# Patient Record
Sex: Female | Born: 1935 | Race: White | Hispanic: No | Smoking: Never smoker
Health system: Southern US, Community
[De-identification: ages and names within clinical notes are randomized; demographics above are authoritative.]

## PROBLEM LIST (undated history)

## (undated) DIAGNOSIS — F0390 Unspecified dementia without behavioral disturbance: Secondary | ICD-10-CM

## (undated) DIAGNOSIS — K5792 Diverticulitis of intestine, part unspecified, without perforation or abscess without bleeding: Secondary | ICD-10-CM

## (undated) DIAGNOSIS — K219 Gastro-esophageal reflux disease without esophagitis: Secondary | ICD-10-CM

## (undated) DIAGNOSIS — I10 Essential (primary) hypertension: Secondary | ICD-10-CM

## (undated) DIAGNOSIS — W19XXXA Unspecified fall, initial encounter: Secondary | ICD-10-CM

## (undated) DIAGNOSIS — R296 Repeated falls: Secondary | ICD-10-CM

## (undated) DIAGNOSIS — M1990 Unspecified osteoarthritis, unspecified site: Secondary | ICD-10-CM

## (undated) DIAGNOSIS — E039 Hypothyroidism, unspecified: Secondary | ICD-10-CM

---

## 2012-02-25 ENCOUNTER — Emergency Department (HOSPITAL_COMMUNITY)
Admission: EM | Admit: 2012-02-25 | Discharge: 2012-02-25 | Disposition: A | Discharge status: Skilled Nursing Facility | Payer: Medicare Other | Attending: Emergency Medicine | Admitting: Emergency Medicine

## 2012-02-25 ENCOUNTER — Encounter (HOSPITAL_COMMUNITY): Payer: Self-pay | Admitting: *Deleted

## 2012-02-25 DIAGNOSIS — I10 Essential (primary) hypertension: Secondary | ICD-10-CM | POA: Insufficient documentation

## 2012-02-25 DIAGNOSIS — M129 Arthropathy, unspecified: Secondary | ICD-10-CM | POA: Insufficient documentation

## 2012-02-25 DIAGNOSIS — Z008 Encounter for other general examination: Secondary | ICD-10-CM | POA: Insufficient documentation

## 2012-02-25 DIAGNOSIS — E039 Hypothyroidism, unspecified: Secondary | ICD-10-CM | POA: Insufficient documentation

## 2012-02-25 DIAGNOSIS — F0390 Unspecified dementia without behavioral disturbance: Secondary | ICD-10-CM | POA: Insufficient documentation

## 2012-02-25 DIAGNOSIS — W19XXXA Unspecified fall, initial encounter: Secondary | ICD-10-CM

## 2012-02-25 DIAGNOSIS — K219 Gastro-esophageal reflux disease without esophagitis: Secondary | ICD-10-CM | POA: Insufficient documentation

## 2012-02-25 HISTORY — DX: Hypothyroidism, unspecified: E03.9

## 2012-02-25 HISTORY — DX: Unspecified osteoarthritis, unspecified site: M19.90

## 2012-02-25 HISTORY — DX: Gastro-esophageal reflux disease without esophagitis: K21.9

## 2012-02-25 HISTORY — DX: Unspecified dementia, unspecified severity, without behavioral disturbance, psychotic disturbance, mood disturbance, and anxiety: F03.90

## 2012-02-25 HISTORY — DX: Essential (primary) hypertension: I10

## 2012-02-25 HISTORY — DX: Diverticulitis of intestine, part unspecified, without perforation or abscess without bleeding: K57.92

## 2012-02-25 NOTE — ED Provider Notes (Signed)
History    78 year old female transferred from nursing facility for evaluation after a fall. This was unwitnessed. Patient was found on the floor sitting against the wall. The circumstances for her getting they are not clear. Patient is demented at baseline. No report of acute change in her mental status. Patient is demented and history is somewhat questionable, but patient denies any pain. No blood thinning medications per review of medication list.  CSN: [id number]  Arrival date & time 02/25/12  1191   First MD Initiated Contact with Patient 02/25/12 [phone]      Chief Complaint  Patient presents with  . Fall    (Consider location/radiation/quality/duration/timing/severity/associated sxs/prior treatment) HPI  Past Medical History  Diagnosis Date  . Dementia   . Hypothyroid   . Arthritis   . Hypertension   . GERD (gastroesophageal reflux disease)   . Diverticulitis     History reviewed. No pertinent past surgical history.  History reviewed. No pertinent family history.  History  Substance Use Topics  . Smoking status: Not on file  . Smokeless tobacco: Not on file  . Alcohol Use: No    OB History    Grav Para Term Preterm Abortions TAB SAB Ect Mult Living                  Review of Systems   Review of symptoms negative unless otherwise noted in HPI.   Allergies  Aricept; Codeine; Hydrocodone; Meloxicam; and Sulfa antibiotics  Home Medications   Current Outpatient Rx  Name Route Sig Dispense Refill  . ACETAMINOPHEN 500 MG PO TABS Oral Take 500 mg by mouth every 12 (twelve) hours. 9a and 9p scheduled doses    . CHOLECALCIFEROL 1000 UNITS PO CAPS Oral Take 1,000 Units by mouth daily. Agitation/ anxiety    . LEVOTHYROXINE SODIUM 50 MCG PO TABS Oral Take 50 mcg by mouth daily.    Marland Kitchen LORAZEPAM 0.5 MG PO TABS Oral Take 0.25-0.5 mg by mouth every 12 (twelve) hours as needed.    . TRIPLE ANTIBIOTIC [phone] EX OINT Topical Apply 1 application topically daily. Apply  to wound with daily dressing change to left arm    . NYSTATIN-TRIAMCINOLONE 100000-0.1 UNIT/GM-% EX CREA Topical Apply 1 application topically 2 (two) times daily. Apply to patient's bottom    . FISH OIL PO Oral Take 1 capsule by mouth daily. 1000-340mg     . OMEPRAZOLE 20 MG PO CPDR Oral Take 20 mg by mouth daily.      BP 162/74  Pulse 62  Temp 97.5 F (36.4 C) (Oral)  Resp 16  SpO2 98%  Physical Exam  Nursing note and vitals reviewed. Constitutional:       Laying in bed. No acute distress. Frail appearing.  HENT:  Head: Normocephalic and atraumatic.  Eyes: Conjunctivae are normal. Right eye exhibits no discharge. Left eye exhibits no discharge.  Neck: Neck supple.  Cardiovascular: Normal rate, regular rhythm and normal heart sounds.  Exam reveals no gallop and no friction rub.   No murmur heard. Pulmonary/Chest: Effort normal and breath sounds normal. No respiratory distress.  Abdominal: Soft. She exhibits no distension. There is no tenderness.  Musculoskeletal: She exhibits no edema and no tenderness.       No midline spinal tenderness. No bony tenderness of the extremities , chest wall or back. Range of motion of the large joints without apparent discomfort.  Neurological: She is alert.       Pleasantly demented. Disoriented to place and time. Does  follow basic commands. Moving all extremities equally. No focal deficits noted.  Skin: Skin is warm and dry.  Psychiatric: She has a normal mood and affect. Her behavior is normal. Thought content normal.    ED Course  Procedures (including critical care time)  Labs Reviewed - No data to display No results found.  EKG:  Rhythm: sinus with PACs Rate: 65 Axis: Left Intervals: LAFB ST segments: t wave flattening inferiorly and laterally   1. Fall at nursing home       MDM  78 year old female transferred from nursing home for evaluation after fall. She has no acute complaints. She's a nonfocal exam. She appears to be at  her baseline from what is reported. Do not feel that imaging or further testing is indicated at this time. Patient has been medically screen. Extremely low suspicion for urgent/emergent issue. We'll discharge back to nursing facility.        [name], MD 03/04/12 1600

## 2012-02-25 NOTE — ED Notes (Signed)
[facility] called for transportation  

## 2012-02-25 NOTE — ED Notes (Signed)
Patient arrived via EMS from [facility].  Staff reports she fell.  When EMS arrived she was sitting on the floor resting against the wall. Hematoma to the right side of her.  Denies pain.

## 2012-05-26 ENCOUNTER — Encounter (HOSPITAL_COMMUNITY): Payer: Self-pay | Admitting: Emergency Medicine

## 2012-05-26 ENCOUNTER — Emergency Department (HOSPITAL_COMMUNITY)
Admission: EM | Admit: 2012-05-26 | Discharge: 2012-05-26 | Disposition: A | Discharge status: Home / Self Care | Payer: Medicare Other | Attending: Emergency Medicine | Admitting: Emergency Medicine

## 2012-05-26 ENCOUNTER — Emergency Department (HOSPITAL_COMMUNITY): Payer: Medicare Other

## 2012-05-26 DIAGNOSIS — N390 Urinary tract infection, site not specified: Secondary | ICD-10-CM | POA: Insufficient documentation

## 2012-05-26 DIAGNOSIS — Y9210 Unspecified residential institution as the place of occurrence of the external cause: Secondary | ICD-10-CM | POA: Insufficient documentation

## 2012-05-26 DIAGNOSIS — K219 Gastro-esophageal reflux disease without esophagitis: Secondary | ICD-10-CM | POA: Insufficient documentation

## 2012-05-26 DIAGNOSIS — Z8739 Personal history of other diseases of the musculoskeletal system and connective tissue: Secondary | ICD-10-CM | POA: Insufficient documentation

## 2012-05-26 DIAGNOSIS — I10 Essential (primary) hypertension: Secondary | ICD-10-CM | POA: Insufficient documentation

## 2012-05-26 DIAGNOSIS — S0990XA Unspecified injury of head, initial encounter: Secondary | ICD-10-CM | POA: Insufficient documentation

## 2012-05-26 DIAGNOSIS — E039 Hypothyroidism, unspecified: Secondary | ICD-10-CM | POA: Insufficient documentation

## 2012-05-26 DIAGNOSIS — Y939 Activity, unspecified: Secondary | ICD-10-CM | POA: Insufficient documentation

## 2012-05-26 DIAGNOSIS — S0101XA Laceration without foreign body of scalp, initial encounter: Secondary | ICD-10-CM

## 2012-05-26 DIAGNOSIS — Z8719 Personal history of other diseases of the digestive system: Secondary | ICD-10-CM | POA: Insufficient documentation

## 2012-05-26 DIAGNOSIS — Z79899 Other long term (current) drug therapy: Secondary | ICD-10-CM | POA: Insufficient documentation

## 2012-05-26 DIAGNOSIS — W19XXXA Unspecified fall, initial encounter: Secondary | ICD-10-CM | POA: Insufficient documentation

## 2012-05-26 DIAGNOSIS — S0100XA Unspecified open wound of scalp, initial encounter: Secondary | ICD-10-CM | POA: Insufficient documentation

## 2012-05-26 DIAGNOSIS — F0390 Unspecified dementia without behavioral disturbance: Secondary | ICD-10-CM | POA: Insufficient documentation

## 2012-05-26 LAB — PROTIME-INR
INR: 0.93 (ref 0.00–1.49)
Prothrombin Time: 12.4 seconds (ref 11.6–15.2)

## 2012-05-26 LAB — URINALYSIS, ROUTINE W REFLEX MICROSCOPIC
Bilirubin Urine: NEGATIVE
Nitrite: NEGATIVE
Specific Gravity, Urine: 1.009 (ref 1.005–1.030)
Urobilinogen, UA: 1 mg/dL (ref 0.0–1.0)

## 2012-05-26 LAB — CBC
Platelets: 182 10*3/uL (ref 150–400)
RBC: 3.83 MIL/uL — ABNORMAL LOW (ref 3.87–5.11)
WBC: 6.2 10*3/uL (ref 4.0–10.5)

## 2012-05-26 LAB — BASIC METABOLIC PANEL
CO2: 30 mEq/L (ref 19–32)
Chloride: 103 mEq/L (ref 96–112)
Sodium: 139 mEq/L (ref 135–145)

## 2012-05-26 LAB — URINE MICROSCOPIC-ADD ON

## 2012-05-26 MED ORDER — CEPHALEXIN 500 MG PO CAPS: 500.0000 mg | ORAL_CAPSULE | Freq: Four times a day (QID) | ORAL | Status: DC

## 2012-05-26 MED ORDER — LIDOCAINE HCL (PF) 1 % IJ SOLN
INTRAMUSCULAR | Status: AC
  Administered 2012-05-26: 2.1 mL
  Filled 2012-05-26: qty 5

## 2012-05-26 MED ORDER — CEFTRIAXONE SODIUM 1 G IJ SOLR
1.0000 g | Freq: Once | INTRAMUSCULAR | Status: AC
  Administered 2012-05-26: 1 g via INTRAMUSCULAR
  Filled 2012-05-26: qty 10

## 2012-05-26 NOTE — ED Provider Notes (Signed)
History     CSN: [id number]  Arrival date & time 05/26/12  6213   First MD Initiated Contact with Patient 05/26/12 1007      Chief Complaint  Patient presents with  . Fall     The history is provided by the patient, the EMS personnel and the nursing home. History Limited By: Level V caveat: Dementia.   and the patient fell while in the bathroom this morning.  It's unknown to patient loss consciousness.  The patient is without any anticoagulant use.  The patient presents with a laceration to her her right parietal scalp.  Mild neck pain.  No weakness of her upper lower extremities.  The patient has dementia and therefore is unable to provide additional history.  Family reports is at baseline mental status  Past Medical History  Diagnosis Date  . Dementia   . Hypothyroid   . Arthritis   . Hypertension   . GERD (gastroesophageal reflux disease)   . Diverticulitis     History reviewed. No pertinent past surgical history.  No family history on file.  History  Substance Use Topics  . Smoking status: Not on file  . Smokeless tobacco: Not on file  . Alcohol Use: No    OB History    Grav Para Term Preterm Abortions TAB SAB Ect Mult Living                  Review of Systems  Unable to perform ROS   Allergies  Aricept; Codeine; Hydrocodone; Meloxicam; and Sulfa antibiotics  Home Medications   Current Outpatient Rx  Name  Route  Sig  Dispense  Refill  . ACETAMINOPHEN 500 MG PO TABS   Oral   Take 500 mg by mouth 2 (two) times daily. 9a and 9p scheduled doses         . VITAMIN D 2000 UNITS PO TABS   Oral   Take 2,000 Units by mouth daily.         Marland Kitchen LEVOTHYROXINE SODIUM 50 MCG PO TABS   Oral   Take 50 mcg by mouth daily.         Marland Kitchen LORAZEPAM 0.5 MG PO TABS   Oral   Take 0.5 mg by mouth every 6 (six) hours as needed. For agitation         . NYSTATIN-TRIAMCINOLONE 100000-0.1 UNIT/GM-% EX CREA   Topical   Apply 1 application topically 2 (two) times  daily. To Gluteal folds for fungal rash         . FISH OIL PO   Oral   Take 1 capsule by mouth daily. 1000-340mg          . OMEPRAZOLE 20 MG PO CPDR   Oral   Take 20 mg by mouth daily before breakfast.            BP 132/63  Pulse 63  Temp 97.6 F (36.4 C) (Oral)  Resp 18  SpO2 97%  Physical Exam  Nursing note and vitals reviewed. Constitutional: She appears well-developed and well-nourished. No distress.  HENT:  Head: Normocephalic and atraumatic.       Small 0.5 cm laceration to right parietal scalp without active bleeding.  Eyes: EOM are normal.  Neck: Normal range of motion.  Cardiovascular: Normal rate, regular rhythm and normal heart sounds.   Pulmonary/Chest: Effort normal and breath sounds normal.  Abdominal: Soft. She exhibits no distension. There is no tenderness.  Musculoskeletal: Normal range of motion.  Neurological: She is  alert.       5 out of 5 strength in bilateral upper lower extremities  Skin: Skin is warm and dry.  Psychiatric: She has a normal mood and affect. Judgment normal.    ED Course  Procedures   LACERATION REPAIR Performed by: Lyanne Co Consent: Verbal consent obtained. Risks and benefits: risks, benefits and alternatives were discussed Patient identity confirmed: provided demographic data Time out performed prior to procedure Prepped and Draped in normal sterile fashion Wound explored Laceration Location: right parietal scalp Laceration Length: 0.5cm No Foreign Bodies seen or palpated Anesthesia:none Local anesthetic: none Anesthetic total: none Irrigation method: syringe Amount of cleaning: standard Skin closure: staple Number of sutures or staples: 1 Technique: staple Patient tolerance: Patient tolerated the procedure well with no immediate complications.  Labs Reviewed  CBC - Abnormal; Notable for the following:    RBC 3.83 (*)     Hemoglobin 11.2 (*)     HCT 34.5 (*)     All other components within normal  limits  BASIC METABOLIC PANEL - Abnormal; Notable for the following:    GFR calc non Af Amer 42 (*)     GFR calc Af Amer 49 (*)     All other components within normal limits  URINALYSIS, ROUTINE W REFLEX MICROSCOPIC - Abnormal; Notable for the following:    APPearance CLOUDY (*)     Hgb urine dipstick MODERATE (*)     Leukocytes, UA MODERATE (*)     All other components within normal limits  URINE MICROSCOPIC-ADD ON - Abnormal; Notable for the following:    Squamous Epithelial / LPF FEW (*)     Bacteria, UA MANY (*)     All other components within normal limits  PROTIME-INR  URINE CULTURE   Ct Head Wo Contrast  05/26/2012  *RADIOLOGY REPORT*  Clinical Data:  Fall, trauma, seizure, pain.  Trauma top of head. Dimension.  CT HEAD WITHOUT CONTRAST CT CERVICAL SPINE WITHOUT CONTRAST  Technique:  Multidetector CT imaging of the head and cervical spine was performed following the standard protocol without intravenous contrast.  Multiplanar CT image reconstructions of the cervical spine were also generated.  Comparison:   None  CT HEAD  Findings: No intracranial hemorrhage.  No parenchymal contusion. No midline shift or mass effect.  Basilar cisterns are patent. No skull base fracture.  No fluid in the paranasal sinuses or mastoid air cells.  There is extensive cortical atrophy and periventricular white matter hypodensities.  There is a laceration over the right vertex (image 50).  IMPRESSION: No intracranial trauma.  Laceration over the right vertex.  No skull fracture.  Atrophy and microvascular disease.  CT CERVICAL SPINE  Findings: There is no prevertebral soft tissue swelling.  There is bulky osteophytosis at C4-C6.  There is no acute loss vertebral body height or disc height.  Normal facet articulation.  Normal craniocervical junction.  No evidence epidural paraspinal hematoma.  IMPRESSION:  1.  No cervical spine fracture.  2 . Bulky osteophytosis and degenerative disease.   Original Report  Authenticated By: Genevive Bi, M.D.    Ct Cervical Spine Wo Contrast  05/26/2012  *RADIOLOGY REPORT*  Clinical Data:  Fall, trauma, seizure, pain.  Trauma top of head. Dimension.  CT HEAD WITHOUT CONTRAST CT CERVICAL SPINE WITHOUT CONTRAST  Technique:  Multidetector CT imaging of the head and cervical spine was performed following the standard protocol without intravenous contrast.  Multiplanar CT image reconstructions of the cervical spine were also generated.  Comparison:   None  CT HEAD  Findings: No intracranial hemorrhage.  No parenchymal contusion. No midline shift or mass effect.  Basilar cisterns are patent. No skull base fracture.  No fluid in the paranasal sinuses or mastoid air cells.  There is extensive cortical atrophy and periventricular white matter hypodensities.  There is a laceration over the right vertex (image 50).  IMPRESSION: No intracranial trauma.  Laceration over the right vertex.  No skull fracture.  Atrophy and microvascular disease.  CT CERVICAL SPINE  Findings: There is no prevertebral soft tissue swelling.  There is bulky osteophytosis at C4-C6.  There is no acute loss vertebral body height or disc height.  Normal facet articulation.  Normal craniocervical junction.  No evidence epidural paraspinal hematoma.  IMPRESSION:  1.  No cervical spine fracture.  2 . Bulky osteophytosis and degenerative disease.   Original Report Authenticated By: Genevive Bi, M.D.    I personally reviewed the imaging tests through PACS system I reviewed available ER/hospitalization records through the EMR   1. Head injury   2. Laceration of scalp   3. Urinary tract infection       MDM  CT head and cervical spine normal.  Single staple placed to right scalp laceration.  Infection warnings given.  Head injury warnings given.  Urine will be treated for a UTI        Lyanne Co, MD 05/27/12 [phone]

## 2012-05-26 NOTE — ED Notes (Signed)
Patient transported to CT 

## 2012-05-26 NOTE — ED Notes (Signed)
Removed [place]  c-collar per EDP

## 2012-05-26 NOTE — ED Notes (Signed)
Pt arrived by EMS from [facility]. Pt fell while in the bathroom this morning. Unknown if pt loss consciousness, staff at facility stated they just heard pt yelling. Pt hit top right of head. Pt ambulating at facility normally.

## 2012-05-28 LAB — URINE CULTURE: Colony Count: >=100000

## 2012-05-29 NOTE — ED Notes (Signed)
+  Urine. Patient treated with Keflex. Sensitive to same. Per protocol MD. °

## 2012-09-09 ENCOUNTER — Encounter (HOSPITAL_COMMUNITY): Payer: Self-pay | Admitting: *Deleted

## 2012-09-09 ENCOUNTER — Emergency Department (HOSPITAL_COMMUNITY)
Admission: EM | Admit: 2012-09-09 | Discharge: 2012-09-09 | Disposition: A | Discharge status: Home / Self Care | Payer: PRIVATE HEALTH INSURANCE | Attending: Emergency Medicine | Admitting: Emergency Medicine

## 2012-09-09 ENCOUNTER — Emergency Department (HOSPITAL_COMMUNITY): Payer: PRIVATE HEALTH INSURANCE

## 2012-09-09 DIAGNOSIS — S0003XA Contusion of scalp, initial encounter: Secondary | ICD-10-CM | POA: Insufficient documentation

## 2012-09-09 DIAGNOSIS — W19XXXA Unspecified fall, initial encounter: Secondary | ICD-10-CM

## 2012-09-09 DIAGNOSIS — Y9210 Unspecified residential institution as the place of occurrence of the external cause: Secondary | ICD-10-CM | POA: Insufficient documentation

## 2012-09-09 DIAGNOSIS — E039 Hypothyroidism, unspecified: Secondary | ICD-10-CM | POA: Insufficient documentation

## 2012-09-09 DIAGNOSIS — Y939 Activity, unspecified: Secondary | ICD-10-CM | POA: Insufficient documentation

## 2012-09-09 DIAGNOSIS — Z79899 Other long term (current) drug therapy: Secondary | ICD-10-CM | POA: Insufficient documentation

## 2012-09-09 DIAGNOSIS — S0083XA Contusion of other part of head, initial encounter: Secondary | ICD-10-CM | POA: Insufficient documentation

## 2012-09-09 DIAGNOSIS — I10 Essential (primary) hypertension: Secondary | ICD-10-CM | POA: Insufficient documentation

## 2012-09-09 DIAGNOSIS — S61409A Unspecified open wound of unspecified hand, initial encounter: Secondary | ICD-10-CM | POA: Insufficient documentation

## 2012-09-09 DIAGNOSIS — F0390 Unspecified dementia without behavioral disturbance: Secondary | ICD-10-CM | POA: Insufficient documentation

## 2012-09-09 DIAGNOSIS — K219 Gastro-esophageal reflux disease without esophagitis: Secondary | ICD-10-CM | POA: Insufficient documentation

## 2012-09-09 DIAGNOSIS — Z8719 Personal history of other diseases of the digestive system: Secondary | ICD-10-CM | POA: Insufficient documentation

## 2012-09-09 DIAGNOSIS — S61411A Laceration without foreign body of right hand, initial encounter: Secondary | ICD-10-CM

## 2012-09-09 DIAGNOSIS — W1809XA Striking against other object with subsequent fall, initial encounter: Secondary | ICD-10-CM | POA: Insufficient documentation

## 2012-09-09 NOTE — ED Notes (Signed)
Called and gave report to the nurse on duty at [facility].

## 2012-09-09 NOTE — ED Provider Notes (Addendum)
History     CSN: [id number]  Arrival date & time 09/09/12  0004   First MD Initiated Contact with Patient 09/09/12 0005      Chief Complaint  Patient presents with  . Fall    (Consider location/radiation/quality/duration/timing/severity/associated sxs/prior treatment) HPI 79 year old female presents from her nursing home facility with complaint of fall.  Patient has dementia, does not remember falling.  The fall was unwitnessed.  Patient has skin tear to the dorsum of her right hand, and contusion to her right for head.  It is unknown if she had LOC.  She denies any pain at this time. Past Medical History  Diagnosis Date  . Dementia   . Hypothyroid   . Arthritis   . Hypertension   . GERD (gastroesophageal reflux disease)   . Diverticulitis     History reviewed. No pertinent past surgical history.  History reviewed. No pertinent family history.  History  Substance Use Topics  . Smoking status: Not on file  . Smokeless tobacco: Not on file  . Alcohol Use: No    OB History   Grav Para Term Preterm Abortions TAB SAB Ect Mult Living                  Review of Systems  Unable to perform ROS: Dementia    Allergies  Aricept; Codeine; Hydrocodone; Meloxicam; and Sulfa antibiotics  Home Medications   Current Outpatient Rx  Name  Route  Sig  Dispense  Refill  . acetaminophen (TYLENOL) 500 MG tablet   Oral   Take 500 mg by mouth 2 (two) times daily. 9a and 9p scheduled doses         . Cholecalciferol (VITAMIN D) 2000 UNITS tablet   Oral   Take 2,000 Units by mouth daily.         . Control Gel Formula Dressing (DUODERM CGF EXTRA THIN EX)   Apply externally   Apply topically every 7 (seven) days.         Marland Kitchen levothyroxine (SYNTHROID, LEVOTHROID) 50 MCG tablet   Oral   Take 50 mcg by mouth daily.         Marland Kitchen LORazepam (ATIVAN) 0.5 MG tablet   Oral   Take 0.5 mg by mouth every 6 (six) hours as needed. For agitation         . Omega-3 Fatty Acids (FISH  OIL PO)   Oral   Take 1 capsule by mouth daily. 1000-340mg          . omeprazole (PRILOSEC) 20 MG capsule   Oral   Take 20 mg by mouth daily before breakfast.            BP 157/76  Pulse 63  Temp(Src) 98.1 F (36.7 C) (Oral)  Resp 18  SpO2 99%  Physical Exam  Nursing note and vitals reviewed. Constitutional: She appears well-developed and well-nourished. No distress.  HENT:  Head: Normocephalic.  Right Ear: External ear normal.  Left Ear: External ear normal.  Nose: Nose normal.  Mouth/Throat: Oropharynx is clear and moist.  Contusion to the left upper for head, right at the hairline.  Mild tenderness with palpation over this area  Eyes: Conjunctivae and EOM are normal. Pupils are equal, round, and reactive to light.  Neck: Normal range of motion. Neck supple. No JVD present. No tracheal deviation present. No thyromegaly present.  Cardiovascular: Normal rate, regular rhythm, normal heart sounds and intact distal pulses.  Exam reveals no gallop and no friction rub.  No murmur heard. Pulmonary/Chest: Effort normal and breath sounds normal. No stridor. No respiratory distress. She has no wheezes. She has no rales. She exhibits no tenderness.  Abdominal: Soft. Bowel sounds are normal. She exhibits no distension and no mass. There is no tenderness. There is no rebound and no guarding.  Musculoskeletal: Normal range of motion. She exhibits no edema and no tenderness.  Skin tear to right dorsum of hand  Lymphadenopathy:    She has no cervical adenopathy.  Neurological: She is alert. She has normal reflexes. No cranial nerve deficit. She exhibits normal muscle tone. Coordination normal.  Skin: Skin is warm and dry. No rash noted. She is not diaphoretic. No erythema. No pallor.  Psychiatric: She has a normal mood and affect. Her behavior is normal. Judgment and thought content normal.    ED Course  Procedures (including critical care time)  Labs Reviewed - No data to  display Ct Head Wo Contrast  09/09/2012  *RADIOLOGY REPORT*  Clinical Data:  Larey Seat striking head, frontal contusion, history dementia, hypertension  CT HEAD WITHOUT CONTRAST CT CERVICAL SPINE WITHOUT CONTRAST  Technique:  Multidetector CT imaging of the head and cervical spine was performed following the standard protocol without intravenous contrast.  Multiplanar CT image reconstructions of the cervical spine were also generated.  Comparison:  05/26/2012  CT HEAD  Findings: Generalized atrophy. Normal ventricle morphology. No midline shift or mass effect. Small vessel chronic ischemic changes of deep cerebral white matter. No intracranial hemorrhage, mass lesion, or evidence of acute infarction. No extra-axial fluid collections. Bones appear diffusely demineralized. Paranasal sinuses and mastoid air cells clear. Small frontal scalp hematoma. No calvarial fracture identified.  IMPRESSION: Atrophy with small vessel chronic ischemic changes of deep cerebral white matter. No acute intracranial abnormalities.  CT CERVICAL SPINE  Findings: Prevertebral soft tissues normal thickness. Disc space narrowing with bulky anterior spurs at C4-C5, C5-C6, and C6-C7. Additional disc space narrowing at C3-C4. Multilevel facet degenerative changes throughout cervical spine. Vertebral body heights maintained without fracture or subluxation. Visualized skull base intact. Lung apices clear. Cystic changes at odontoid process slightly increased since previous study.  IMPRESSION: Osseous demineralization. Degenerative disc and facet disease changes of the cervical spine. Cystic degenerative changes of odontoid process. No acute cervical spine abnormalities identified.   Original Report Authenticated By: Ulyses Southward, M.D.    Ct Cervical Spine Wo Contrast  09/09/2012  *RADIOLOGY REPORT*  Clinical Data:  Larey Seat striking head, frontal contusion, history dementia, hypertension  CT HEAD WITHOUT CONTRAST CT CERVICAL SPINE WITHOUT CONTRAST   Technique:  Multidetector CT imaging of the head and cervical spine was performed following the standard protocol without intravenous contrast.  Multiplanar CT image reconstructions of the cervical spine were also generated.  Comparison:  05/26/2012  CT HEAD  Findings: Generalized atrophy. Normal ventricle morphology. No midline shift or mass effect. Small vessel chronic ischemic changes of deep cerebral white matter. No intracranial hemorrhage, mass lesion, or evidence of acute infarction. No extra-axial fluid collections. Bones appear diffusely demineralized. Paranasal sinuses and mastoid air cells clear. Small frontal scalp hematoma. No calvarial fracture identified.  IMPRESSION: Atrophy with small vessel chronic ischemic changes of deep cerebral white matter. No acute intracranial abnormalities.  CT CERVICAL SPINE  Findings: Prevertebral soft tissues normal thickness. Disc space narrowing with bulky anterior spurs at C4-C5, C5-C6, and C6-C7. Additional disc space narrowing at C3-C4. Multilevel facet degenerative changes throughout cervical spine. Vertebral body heights maintained without fracture or subluxation. Visualized skull base intact. Lung apices  clear. Cystic changes at odontoid process slightly increased since previous study.  IMPRESSION: Osseous demineralization. Degenerative disc and facet disease changes of the cervical spine. Cystic degenerative changes of odontoid process. No acute cervical spine abnormalities identified.   Original Report Authenticated By: Ulyses Southward, M.D.     Date: 09/09/2012  Rate: 59  Rhythm: sinus bradycardia  QRS Axis: left  Intervals: normal  ST/T Wave abnormalities: nonspecific T wave changes  Conduction Disutrbances:left anterior fascicular block  Narrative Interpretation: pacs resolved from prior  Old EKG Reviewed: changes noted    1. Fall at nursing home, initial encounter   2. Skin tear of hand without complication, right, initial encounter   3. Forehead  contusion, initial encounter       MDM  79 year old female status post fall with forehead contusion and right hand, skin tear.  CT scans without acute injury.  Skin tear dressed.  Patient does follow up with her primary care doctor as needed.        Olivia Mackie, MD 09/09/12 1610  Olivia Mackie, MD 09/09/12 [phone]

## 2012-09-09 NOTE — ED Notes (Addendum)
Per EMS: pt from [facility] pt fell, hit head and also has a skin tear on her right hand. Pt skin tear is bandaged and pt states that the only pain she has. Pt denies HA, pt has hx of dementia does not remember fall.

## 2012-09-09 NOTE — ED Notes (Signed)
Secretary called [facility] to arrange for transport to the patient's residence.

## 2012-11-13 ENCOUNTER — Emergency Department (HOSPITAL_COMMUNITY): Payer: PRIVATE HEALTH INSURANCE

## 2012-11-13 ENCOUNTER — Emergency Department (HOSPITAL_COMMUNITY)
Admission: EM | Admit: 2012-11-13 | Discharge: 2012-11-13 | Disposition: A | Discharge status: Home / Self Care | Payer: PRIVATE HEALTH INSURANCE | Attending: Emergency Medicine | Admitting: Emergency Medicine

## 2012-11-13 DIAGNOSIS — Z8719 Personal history of other diseases of the digestive system: Secondary | ICD-10-CM | POA: Insufficient documentation

## 2012-11-13 DIAGNOSIS — F0390 Unspecified dementia without behavioral disturbance: Secondary | ICD-10-CM | POA: Insufficient documentation

## 2012-11-13 DIAGNOSIS — Y939 Activity, unspecified: Secondary | ICD-10-CM | POA: Insufficient documentation

## 2012-11-13 DIAGNOSIS — F29 Unspecified psychosis not due to a substance or known physiological condition: Secondary | ICD-10-CM | POA: Insufficient documentation

## 2012-11-13 DIAGNOSIS — Z79899 Other long term (current) drug therapy: Secondary | ICD-10-CM | POA: Insufficient documentation

## 2012-11-13 DIAGNOSIS — W1789XA Other fall from one level to another, initial encounter: Secondary | ICD-10-CM | POA: Insufficient documentation

## 2012-11-13 DIAGNOSIS — W19XXXA Unspecified fall, initial encounter: Secondary | ICD-10-CM

## 2012-11-13 DIAGNOSIS — E039 Hypothyroidism, unspecified: Secondary | ICD-10-CM | POA: Insufficient documentation

## 2012-11-13 DIAGNOSIS — K219 Gastro-esophageal reflux disease without esophagitis: Secondary | ICD-10-CM | POA: Insufficient documentation

## 2012-11-13 DIAGNOSIS — I10 Essential (primary) hypertension: Secondary | ICD-10-CM | POA: Insufficient documentation

## 2012-11-13 DIAGNOSIS — M129 Arthropathy, unspecified: Secondary | ICD-10-CM | POA: Insufficient documentation

## 2012-11-13 DIAGNOSIS — Z043 Encounter for examination and observation following other accident: Secondary | ICD-10-CM | POA: Insufficient documentation

## 2012-11-13 DIAGNOSIS — Y9210 Unspecified residential institution as the place of occurrence of the external cause: Secondary | ICD-10-CM | POA: Insufficient documentation

## 2012-11-13 NOTE — ED Notes (Signed)
Pt arrived from [facility] at [facility] via EMS with a complaint of an unwitnessed fall. Pt was found on the carpeted floor complaining of pain on her right side.  No visible deformities noted.

## 2012-11-13 NOTE — ED Provider Notes (Signed)
History     CSN: [id number]  Arrival date & time 11/13/12  2027   First MD Initiated Contact with Patient 11/13/12 2028      Chief Complaint  Patient presents with  . Fall    (Consider location/radiation/quality/duration/timing/severity/associated sxs/prior treatment) HPI Comments: Patient lives in an Dementia unit, had an unwitnessed fall, complaints vary with time.  Patient was ambulated to bed, no abrasions, skin tears, deformities noted   Patient is a 79 y.o. female presenting with fall. The history is provided by the EMS personnel. The history is limited by the condition of the patient.  Fall The fall occurred in unknown circumstances. She fell from a height of 3 to 5 ft. She landed on carpet. Pertinent negatives include no fever.    Past Medical History  Diagnosis Date  . Dementia   . Hypothyroid   . Arthritis   . Hypertension   . GERD (gastroesophageal reflux disease)   . Diverticulitis     No past surgical history on file.  No family history on file.  History  Substance Use Topics  . Smoking status: Not on file  . Smokeless tobacco: Not on file  . Alcohol Use: No    OB History   Grav Para Term Preterm Abortions TAB SAB Ect Mult Living                  Review of Systems  Constitutional: Negative for fever.  Skin: Negative for wound.  All other systems reviewed and are negative.    Allergies  Aricept; Codeine; Hydrocodone; Meloxicam; and Sulfa antibiotics  Home Medications   Current Outpatient Rx  Name  Route  Sig  Dispense  Refill  . acetaminophen (TYLENOL) 500 MG tablet   Oral   Take 500 mg by mouth 2 (two) times daily. 9a and 9p scheduled doses         . Cholecalciferol (VITAMIN D) 2000 UNITS tablet   Oral   Take 2,000 Units by mouth daily.         . Control Gel Formula Dressing (DUODERM CGF EXTRA THIN EX)   Apply externally   Apply topically every 7 (seven) days.         Marland Kitchen levothyroxine (SYNTHROID, LEVOTHROID) 50 MCG tablet  Oral   Take 50 mcg by mouth daily.         Marland Kitchen LORazepam (ATIVAN) 0.5 MG tablet   Oral   Take 0.5 mg by mouth every 6 (six) hours as needed. For agitation         . LORazepam (ATIVAN) 0.5 MG tablet   Oral   Take 0.5 mg by mouth daily at 12 noon.         Marland Kitchen omega-3 acid ethyl esters (LOVAZA) 1 G capsule   Oral   Take 1 g by mouth daily.         Marland Kitchen omeprazole (PRILOSEC) 20 MG capsule   Oral   Take 40 mg by mouth daily before breakfast.            BP 147/72  Pulse 65  Temp(Src) 98 F (36.7 C) (Oral)  SpO2 96%  Physical Exam  Nursing note and vitals reviewed. Constitutional: She appears well-developed and well-nourished.  HENT:  Head: Normocephalic and atraumatic.  Eyes: Pupils are equal, round, and reactive to light.  Neck: Normal range of motion. No spinous process tenderness and no muscular tenderness present. Normal range of motion present.  Cardiovascular: Normal rate and regular rhythm.  Pulmonary/Chest: Effort normal and breath sounds normal. She exhibits no tenderness.  Abdominal: Soft. Bowel sounds are normal. She exhibits no distension. There is no tenderness.  Musculoskeletal: Normal range of motion. She exhibits no edema and no tenderness.  Neurological: She is alert.  Pleasantly confused  Skin: Skin is warm and dry. No rash noted. No erythema.    ED Course  Procedures (including critical care time)  Labs Reviewed - No data to display Dg Cervical Spine Complete  11/13/2012   *RADIOLOGY REPORT*  Clinical Data: Fall.  Neck injury and pain.  CERVICAL SPINE - COMPLETE 4+ VIEW  Comparison: CT on 09/09/2012  Findings: No evidence of acute fracture, subluxation, or prevertebral soft tissue swelling.  Degenerative disc disease with prominent anterior osteophyte formation is again seen from levels of C4-C7.  Bilateral facet DJD is also seen which is most pronounced on the right at C2-3 on the left at C3-4. Generalized osteopenia again noted.  IMPRESSION:  1.  No  acute findings. 2.  Degenerative spondylosis, as described above. 3.  Osteopenia.   Original Report Authenticated By: Myles Rosenthal, M.D.   Ct Head Wo Contrast  11/13/2012   *RADIOLOGY REPORT*  Clinical Data: Fall.  Blunt trauma to posterior head.  Headache.  CT HEAD WITHOUT CONTRAST  Technique:  Contiguous axial images were obtained from the base of the skull through the vertex without contrast.  Comparison: 09/09/2012  Findings: There is no evidence of intracranial hemorrhage, brain edema or other signs of acute infarction.  There is no evidence of intracranial mass lesion or mass effect.  No abnormal extra-axial fluid collections are identified.  Moderate diffuse cerebral atrophy and chronic small vessel disease is stable in appearance.  Ventricles are stable in size.  No evidence of skull fracture or pneumocephalus.  IMPRESSION:  1.  No acute findings. 2.  Stable cerebral atrophy and chronic small vessel disease.   Original Report Authenticated By: Myles Rosenthal, M.D.     1. Fall, initial encounter       MDM   X ray s reviewed all negative        Arman Filter, NP 11/13/12 2143

## 2012-11-13 NOTE — ED Notes (Signed)
Patient transported to CT and returned 

## 2012-11-13 NOTE — ED Notes (Signed)
[id number]<AV> Expected date:<BR> Expected time:<BR> Means of arrival:<BR> Comments:<BR> EMS, 79yo, Fall

## 2012-11-14 NOTE — ED Provider Notes (Signed)
Medical screening examination/treatment/procedure(s) were conducted as a shared visit with non-physician practitioner(s) and myself.  I personally evaluated the patient during the encounter  unwitnessed fall from ECF.  At baseline. Neuro intact, moving all extremities. Some pain over L hip, able to bear weight.  Glynn Octave, MD 11/14/12 [phone]

## 2013-03-14 ENCOUNTER — Emergency Department (HOSPITAL_COMMUNITY)
Admission: EM | Admit: 2013-03-14 | Discharge: 2013-03-14 | Disposition: A | Discharge status: Home / Self Care | Payer: PRIVATE HEALTH INSURANCE | Attending: Emergency Medicine | Admitting: Emergency Medicine

## 2013-03-14 ENCOUNTER — Encounter (HOSPITAL_COMMUNITY): Payer: Self-pay | Admitting: *Deleted

## 2013-03-14 ENCOUNTER — Emergency Department (HOSPITAL_COMMUNITY): Payer: PRIVATE HEALTH INSURANCE

## 2013-03-14 DIAGNOSIS — K219 Gastro-esophageal reflux disease without esophagitis: Secondary | ICD-10-CM | POA: Insufficient documentation

## 2013-03-14 DIAGNOSIS — Y9210 Unspecified residential institution as the place of occurrence of the external cause: Secondary | ICD-10-CM | POA: Insufficient documentation

## 2013-03-14 DIAGNOSIS — Z79899 Other long term (current) drug therapy: Secondary | ICD-10-CM | POA: Insufficient documentation

## 2013-03-14 DIAGNOSIS — S0003XA Contusion of scalp, initial encounter: Secondary | ICD-10-CM | POA: Insufficient documentation

## 2013-03-14 DIAGNOSIS — S40012A Contusion of left shoulder, initial encounter: Secondary | ICD-10-CM

## 2013-03-14 DIAGNOSIS — S40019A Contusion of unspecified shoulder, initial encounter: Secondary | ICD-10-CM | POA: Insufficient documentation

## 2013-03-14 DIAGNOSIS — F0390 Unspecified dementia without behavioral disturbance: Secondary | ICD-10-CM | POA: Insufficient documentation

## 2013-03-14 DIAGNOSIS — W19XXXA Unspecified fall, initial encounter: Secondary | ICD-10-CM | POA: Insufficient documentation

## 2013-03-14 DIAGNOSIS — E039 Hypothyroidism, unspecified: Secondary | ICD-10-CM | POA: Insufficient documentation

## 2013-03-14 DIAGNOSIS — Y9389 Activity, other specified: Secondary | ICD-10-CM | POA: Insufficient documentation

## 2013-03-14 DIAGNOSIS — I10 Essential (primary) hypertension: Secondary | ICD-10-CM | POA: Insufficient documentation

## 2013-03-14 DIAGNOSIS — M129 Arthropathy, unspecified: Secondary | ICD-10-CM | POA: Insufficient documentation

## 2013-03-14 NOTE — ED Provider Notes (Signed)
CSN: [id number]     Arrival date & time 03/14/13  0356 History   First MD Initiated Contact with Patient 03/14/13 [phone]     Chief Complaint  Patient presents with  . Fall   (Consider location/radiation/quality/duration/timing/severity/associated sxs/prior Treatment) HPI Comments: 79 year old female history of severe dementia presents from her nursing home where she was found sitting against the bed this morning. She had had an unwitnessed fall and a subsequent hematoma to her left forehead. The patient is otherwise without complaint, no report of seizures in route, vomiting, the patient is unable to give any more history because of her severe dementia and thus a level V caveat applies.    Patient is a 79 y.o. female presenting with fall. The history is provided by the patient and the EMS personnel.  Fall    Past Medical History  Diagnosis Date  . Dementia   . Hypothyroid   . Arthritis   . Hypertension   . GERD (gastroesophageal reflux disease)   . Diverticulitis    History reviewed. No pertinent past surgical history. History reviewed. No pertinent family history. History  Substance Use Topics  . Smoking status: Never Smoker   . Smokeless tobacco: Not on file  . Alcohol Use: No   OB History   Grav Para Term Preterm Abortions TAB SAB Ect Mult Living                 Review of Systems  Unable to perform ROS: Dementia    Allergies  Aricept; Codeine; Hydrocodone; Meloxicam; and Sulfa antibiotics  Home Medications   Current Outpatient Rx  Name  Route  Sig  Dispense  Refill  . acetaminophen (TYLENOL) 500 MG tablet   Oral   Take 500 mg by mouth 2 (two) times daily. 9a and 9p scheduled doses         . acetaminophen (TYLENOL) 500 MG tablet   Oral   Take 1,000 mg by mouth every 4 (four) hours as needed for pain.         . Chloroxylenol-Zinc Oxide (BAZA EX)   Apply externally   Apply 1 application topically as needed. After each diaper change for rash         .  Cholecalciferol (VITAMIN D) 2000 UNITS tablet   Oral   Take 2,000 Units by mouth daily.         Marland Kitchen ibuprofen (ADVIL,MOTRIN) 400 MG tablet   Oral   Take 400 mg by mouth every 8 (eight) hours as needed for pain.         Marland Kitchen levothyroxine (SYNTHROID, LEVOTHROID) 50 MCG tablet   Oral   Take 50 mcg by mouth daily.         Marland Kitchen LORazepam (ATIVAN) 0.5 MG tablet   Oral   Take 0.5 mg by mouth every 6 (six) hours as needed. For agitation         . LORazepam (ATIVAN) 0.5 MG tablet   Oral   Take 0.5 mg by mouth daily at 12 noon.         Marland Kitchen omega-3 acid ethyl esters (LOVAZA) 1 G capsule   Oral   Take 1 g by mouth daily.         Marland Kitchen omeprazole (PRILOSEC) 20 MG capsule   Oral   Take 40 mg by mouth daily before breakfast.          . QUEtiapine (SEROQUEL) 25 MG tablet   Oral   Take 12.5 mg by mouth at  bedtime.         . senna (SENOKOT) 8.6 MG tablet   Oral   Take 1 tablet by mouth daily.          BP 171/67  Pulse 59  Temp(Src) 98.1 F (36.7 C) (Oral)  Resp 18  SpO2 98% Physical Exam  Nursing note and vitals reviewed. Constitutional: She appears well-developed and well-nourished. No distress.  HENT:  Head: Normocephalic.  Mouth/Throat: Oropharynx is clear and moist. No oropharyngeal exudate.  Hematoma to the left forehead, no hemotympanum, no malocclusion, no periorbital ecchymosis, no Battle's sign  Eyes: Conjunctivae and EOM are normal. Pupils are equal, round, and reactive to light. Right eye exhibits no discharge. Left eye exhibits no discharge. No scleral icterus.  Neck: Normal range of motion. Neck supple. No JVD present. No thyromegaly present.  Cardiovascular: Normal rate, regular rhythm, normal heart sounds and intact distal pulses.  Exam reveals no gallop and no friction rub.   No murmur heard. Pulmonary/Chest: Effort normal and breath sounds normal. No respiratory distress. She has no wheezes. She has no rales.  Abdominal: Soft. Bowel sounds are normal. She  exhibits no distension and no mass. There is no tenderness.  Musculoskeletal: Normal range of motion. She exhibits tenderness ( Mild tenderness to the left shoulder, inability to completely extend the left shoulder. No other joint injury or deformities. No leg length discrepancy, no pain with hip rotation). She exhibits no edema.  Lymphadenopathy:    She has no cervical adenopathy.  Neurological: She is alert. Coordination normal.  Skin: Skin is warm and dry. No rash noted. No erythema.  Psychiatric: She has a normal mood and affect. Her behavior is normal.    ED Course  Procedures (including critical care time) Labs Review Labs Reviewed - No data to display Imaging Review Dg Humerus Left  03/14/2013   *RADIOLOGY REPORT*  Clinical Data: Trauma.  Fall.  LEFT HUMERUS - 2+ VIEW  Comparison: None.  Findings: Humeral shaft and distal humerus appear intact.  There is bony irregularity around the greater tuberosity, suspicious for a multi part fracture of the proximal left humerus.  Dedicated shoulder films are recommended.  AC joint osteoarthritis is present.  IMPRESSION: Bony irregularity the proximal humerus suspicious for fracture. Dedicated left shoulder radiographs recommended.   Original Report Authenticated By: Andreas Newport, M.D.    MDM   1. Hematoma of scalp, initial encounter   2. Contusion of left shoulder, initial encounter    The patient is significantly demented, she is able to follow my commands and appears very happy and interactive. There is no signs of posturing or other neurologic injury. Her vital signs are within normal limits for her age. She does have a small hematoma to her for head and some tenderness to her shoulder. I will image her left shoulder to evaluate for humerus fracture. CT scan of the head will be held at this time pending discussion with her son who I tried to contact. He is not at home or not answering his phone at this time. The patient otherwise appears  stable.  I have discussed the care with pt's son, who requests no CT scan and states she has had prior fall on the L shoulder causing a fractures which has now healed with some subsequent chronic pain- the pt has no lacerations, or other deformity.  She will go for plain film of the L shoulder to r/o frx, she otherwise appears stable.  I agree with no CT of  the head at this time as the pt is 89 and severely demented and not likely a good surgical candidate.  Family is in agreement.  Radiology shows:  No obvious new fractures - I have personally viewed and I agree with the radiologist interpretation - she is actively using the LUE and no indication for immobilization at this time.  Meds given in ED:    Vida Roller, MD 03/14/13 [phone]

## 2013-03-14 NOTE — ED Notes (Signed)
EMS called to [facility].  Found patient sitting against bed.  Staff states patient had an un witnessed fall. Patient states she went to sleep in the bed and woke up in the floor.  Patient has a hematoma to the left forehead and a skin Tear to the left knee.

## 2013-03-14 NOTE — ED Notes (Signed)
[facility] notified for transport back to [facility] and I gave report to [facility]

## 2013-05-06 ENCOUNTER — Inpatient Hospital Stay (HOSPITAL_COMMUNITY)
Admission: EM | Admit: 2013-05-06 | Discharge: 2013-05-07 | DRG: 603 | Disposition: A | Discharge status: Nursing Home (Includes ICF) | Payer: PRIVATE HEALTH INSURANCE | Attending: Internal Medicine | Admitting: Internal Medicine

## 2013-05-06 ENCOUNTER — Encounter (HOSPITAL_COMMUNITY): Payer: Self-pay | Admitting: Emergency Medicine

## 2013-05-06 ENCOUNTER — Emergency Department (HOSPITAL_COMMUNITY): Payer: PRIVATE HEALTH INSURANCE

## 2013-05-06 DIAGNOSIS — I10 Essential (primary) hypertension: Secondary | ICD-10-CM | POA: Diagnosis present

## 2013-05-06 DIAGNOSIS — L0390 Cellulitis, unspecified: Secondary | ICD-10-CM | POA: Diagnosis present

## 2013-05-06 DIAGNOSIS — L03019 Cellulitis of unspecified finger: Principal | ICD-10-CM | POA: Diagnosis present

## 2013-05-06 DIAGNOSIS — Z66 Do not resuscitate: Secondary | ICD-10-CM | POA: Diagnosis present

## 2013-05-06 DIAGNOSIS — K219 Gastro-esophageal reflux disease without esophagitis: Secondary | ICD-10-CM | POA: Diagnosis present

## 2013-05-06 DIAGNOSIS — F0390 Unspecified dementia without behavioral disturbance: Secondary | ICD-10-CM | POA: Diagnosis present

## 2013-05-06 DIAGNOSIS — L089 Local infection of the skin and subcutaneous tissue, unspecified: Secondary | ICD-10-CM

## 2013-05-06 DIAGNOSIS — E039 Hypothyroidism, unspecified: Secondary | ICD-10-CM | POA: Diagnosis present

## 2013-05-06 DIAGNOSIS — Z791 Long term (current) use of non-steroidal anti-inflammatories (NSAID): Secondary | ICD-10-CM

## 2013-05-06 DIAGNOSIS — Z79899 Other long term (current) drug therapy: Secondary | ICD-10-CM

## 2013-05-06 DIAGNOSIS — D72829 Elevated white blood cell count, unspecified: Secondary | ICD-10-CM | POA: Diagnosis present

## 2013-05-06 DIAGNOSIS — L02519 Cutaneous abscess of unspecified hand: Principal | ICD-10-CM | POA: Diagnosis present

## 2013-05-06 LAB — CBC WITH DIFFERENTIAL/PLATELET
Basophils Absolute: 0 10*3/uL (ref 0.0–0.1)
Basophils Relative: 0 % (ref 0–1)
Eosinophils Absolute: 0.1 10*3/uL (ref 0.0–0.7)
MCH: 30.1 pg (ref 26.0–34.0)
MCHC: 31.9 g/dL (ref 30.0–36.0)
Monocytes Relative: 9 % (ref 3–12)
Neutrophils Relative %: 77 % (ref 43–77)
Platelets: 194 10*3/uL (ref 150–400)
RDW: 13.6 % (ref 11.5–15.5)

## 2013-05-06 LAB — BASIC METABOLIC PANEL
BUN: 23 mg/dL (ref 6–23)
GFR calc Af Amer: 59 mL/min — ABNORMAL LOW (ref >90)
GFR calc non Af Amer: 51 mL/min — ABNORMAL LOW (ref >90)
Potassium: 5.2 mEq/L — ABNORMAL HIGH (ref 3.5–5.1)
Sodium: 140 mEq/L (ref 135–145)

## 2013-05-06 LAB — CG4 I-STAT (LACTIC ACID): Lactic Acid, Venous: 0.94 mmol/L (ref 0.5–2.2)

## 2013-05-06 MED ORDER — QUETIAPINE 12.5 MG HALF TABLET
12.5000 mg | ORAL_TABLET | Freq: Every day | ORAL | Status: DC
  Administered 2013-05-07: 12.5 mg via ORAL
  Filled 2013-05-06 (×2): qty 1

## 2013-05-06 MED ORDER — ENOXAPARIN SODIUM 40 MG/0.4ML ~~LOC~~ SOLN
40.0000 mg | SUBCUTANEOUS | Status: DC
  Filled 2013-05-06: qty 0.4

## 2013-05-06 MED ORDER — LORAZEPAM 0.5 MG PO TABS
0.5000 mg | ORAL_TABLET | Freq: Every day | ORAL | Status: DC
  Administered 2013-05-07: 0.5 mg via ORAL
  Filled 2013-05-06: qty 1

## 2013-05-06 MED ORDER — ONDANSETRON HCL 4 MG/2ML IJ SOLN: 4.0000 mg | Freq: Four times a day (QID) | INTRAMUSCULAR | Status: DC | PRN

## 2013-05-06 MED ORDER — ACETAMINOPHEN 650 MG RE SUPP: 650.0000 mg | Freq: Four times a day (QID) | RECTAL | Status: DC | PRN

## 2013-05-06 MED ORDER — SENNA 8.6 MG PO TABS
1.0000 | ORAL_TABLET | Freq: Every morning | ORAL | Status: DC
  Administered 2013-05-07: 8.6 mg via ORAL
  Filled 2013-05-06: qty 1

## 2013-05-06 MED ORDER — ACETAMINOPHEN 325 MG PO TABS
650.0000 mg | ORAL_TABLET | Freq: Four times a day (QID) | ORAL | Status: DC | PRN
  Administered 2013-05-07: 650 mg via ORAL
  Filled 2013-05-06: qty 2

## 2013-05-06 MED ORDER — LEVOTHYROXINE SODIUM 50 MCG PO TABS
50.0000 ug | ORAL_TABLET | Freq: Every day | ORAL | Status: DC
  Administered 2013-05-07: 50 ug via ORAL
  Filled 2013-05-06 (×2): qty 1

## 2013-05-06 MED ORDER — OMEGA-3-ACID ETHYL ESTERS 1 G PO CAPS
1.0000 g | ORAL_CAPSULE | Freq: Every morning | ORAL | Status: DC
  Administered 2013-05-07: 1 g via ORAL
  Filled 2013-05-06: qty 1

## 2013-05-06 MED ORDER — ONDANSETRON HCL 4 MG PO TABS: 4.0000 mg | ORAL_TABLET | Freq: Four times a day (QID) | ORAL | Status: DC | PRN

## 2013-05-06 MED ORDER — ALUM & MAG HYDROXIDE-SIMETH 200-200-20 MG/5ML PO SUSP: 30.0000 mL | Freq: Four times a day (QID) | ORAL | Status: DC | PRN

## 2013-05-06 MED ORDER — VANCOMYCIN HCL IN DEXTROSE 1-5 GM/200ML-% IV SOLN
1000.0000 mg | Freq: Once | INTRAVENOUS | Status: AC
  Administered 2013-05-06: 1000 mg via INTRAVENOUS
  Filled 2013-05-06: qty 200

## 2013-05-06 MED ORDER — HYDROMORPHONE HCL PF 1 MG/ML IJ SOLN: 0.5000 mg | INTRAMUSCULAR | Status: DC | PRN

## 2013-05-06 MED ORDER — PANTOPRAZOLE SODIUM 40 MG PO TBEC
40.0000 mg | DELAYED_RELEASE_TABLET | Freq: Every day | ORAL | Status: DC
  Administered 2013-05-07: 40 mg via ORAL
  Filled 2013-05-06: qty 1

## 2013-05-06 MED ORDER — ZOLPIDEM TARTRATE 5 MG PO TABS: 5.0000 mg | ORAL_TABLET | Freq: Every evening | ORAL | Status: DC | PRN

## 2013-05-06 MED ORDER — VANCOMYCIN HCL 1000 MG IV SOLR: 15.0000 mg/kg | Freq: Once | INTRAVENOUS | Status: DC

## 2013-05-06 MED ORDER — SODIUM CHLORIDE 0.9 % IV SOLN
INTRAVENOUS | Status: DC
  Administered 2013-05-07: via INTRAVENOUS

## 2013-05-06 MED ORDER — VITAMIN D3 25 MCG (1000 UNIT) PO TABS
2000.0000 [IU] | ORAL_TABLET | Freq: Every morning | ORAL | Status: DC
  Administered 2013-05-07: 2000 [IU] via ORAL
  Filled 2013-05-06: qty 2

## 2013-05-06 NOTE — ED Notes (Signed)
Pt from Memory Care Unit of [facility] at [facility]  RM. 242. RN noticed swelling and color changes to the ring finger of her L hand yesterday, turning black today. Reports pain from hand into arm, possible spider bite. Pt hx dementia and poor historian.

## 2013-05-06 NOTE — ED Provider Notes (Signed)
CSN: [id number]     Arrival date & time 05/06/13  2129 History   First MD Initiated Contact with Patient 05/06/13 2154     Chief Complaint  Patient presents with  . Finger Injury   (Consider location/radiation/quality/duration/timing/severity/associated sxs/prior Treatment) HPI Comments: 79 year old female presents with progressive left ring finger pain and swelling. The patient is demented at baseline and history is fully obtained from the nursing home. The nurse states that the patient complained of some pain a few days ago and then has a little bit of swelling to the tip. After that they've noticed some progressive swelling. A day or 2 ago they noticed some erythema to the back of her hand which seemed to be worsening. Today when the nurse came on shift she knows that the patient's fingertip was discolored and black. Due to this is centered to the ER. The patient is acting at her normal baseline per the nurse and has not had any systemic signs such as fever or chills.   Past Medical History  Diagnosis Date  . Dementia   . Hypothyroid   . Arthritis   . Hypertension   . GERD (gastroesophageal reflux disease)   . Diverticulitis    History reviewed. No pertinent past surgical history. History reviewed. No pertinent family history. History  Substance Use Topics  . Smoking status: Never Smoker   . Smokeless tobacco: Not on file  . Alcohol Use: No   OB History   Grav Para Term Preterm Abortions TAB SAB Ect Mult Living                 Review of Systems  Unable to perform ROS: Dementia    Allergies  Aricept; Codeine; Hydrocodone; Meloxicam; and Sulfa antibiotics  Home Medications   Current Outpatient Rx  Name  Route  Sig  Dispense  Refill  . acetaminophen (TYLENOL) 500 MG tablet   Oral   Take 500-1,000 mg by mouth See admin instructions. Take 1 tablet at 9a and 9p (scheduled doses) and 2 tablets every 4 hours as needed for pain **Max 3000mg  per day**         .  cholecalciferol (VITAMIN D) 1000 UNITS tablet   Oral   Take 2,000 Units by mouth every morning.         Marland Kitchen ibuprofen (ADVIL,MOTRIN) 400 MG tablet   Oral   Take 400 mg by mouth every 8 (eight) hours as needed for pain.         Marland Kitchen levothyroxine (SYNTHROID, LEVOTHROID) 50 MCG tablet   Oral   Take 50 mcg by mouth daily before breakfast.          . LORazepam (ATIVAN) 0.5 MG tablet   Oral   Take 0.5 mg by mouth daily at 12 noon.         Marland Kitchen LORazepam (ATIVAN) 0.5 MG tablet   Oral   Take 0.5 mg by mouth daily at 12 noon.         . mupirocin cream (BACTROBAN) 2 %   Topical   Apply 1 application topically 3 (three) times daily. For 5 days ending 05/06/13         . omega-3 acid ethyl esters (LOVAZA) 1 G capsule   Oral   Take 1 g by mouth every morning.          Marland Kitchen omeprazole (PRILOSEC) 20 MG capsule   Oral   Take 40 mg by mouth daily before breakfast.          .  QUEtiapine (SEROQUEL) 25 MG tablet   Oral   Take 12.5 mg by mouth at bedtime.         . senna (SENOKOT) 8.6 MG tablet   Oral   Take 1 tablet by mouth every morning.           BP 181/67  Pulse 62  Temp(Src) 98.6 F (37 C) (Oral)  Resp 16  SpO2 97% Physical Exam  Nursing note and vitals reviewed. Constitutional: She appears well-developed and well-nourished.  HENT:  Head: Normocephalic and atraumatic.  Right Ear: External ear normal.  Left Ear: External ear normal.  Nose: Nose normal.  Eyes: Right eye exhibits no discharge. Left eye exhibits no discharge.  Cardiovascular: Intact distal pulses.   Pulmonary/Chest: Effort normal.  Abdominal: She exhibits no distension.  Musculoskeletal:       Left hand: She exhibits tenderness and swelling.       Hands: Neurological: She is alert. She is disoriented.  Skin: Skin is warm and dry. There is erythema.    ED Course  Procedures (including critical care time) Labs Review Labs Reviewed  CBC WITH DIFFERENTIAL - Abnormal; Notable for the following:      WBC 12.4 (*)    Hemoglobin 11.7 (*)    Neutro Abs 9.5 (*)    Monocytes Absolute 1.1 (*)    All other components within normal limits  BASIC METABOLIC PANEL - Abnormal; Notable for the following:    Potassium 5.2 (*)    Glucose, Bld 112 (*)    GFR calc non Af Amer 51 (*)    GFR calc Af Amer 59 (*)    All other components within normal limits  SEDIMENTATION RATE  C-REACTIVE PROTEIN  CG4 I-STAT (LACTIC ACID)   Imaging Review Dg Hand Complete Left  05/06/2013   CLINICAL DATA:  Swelling and color changes of 4th finger left hand, today turning black possible spider bite  EXAM: LEFT HAND - COMPLETE 3+ VIEW  FINDINGS: Diffuse arthropathy. No fracture or dislocation. No periosteal reaction or cortical destruction.  IMPRESSION: Diffuse arthropathy throughout the fingers with no discordant focal abnormalities involving the 4th digit.   Electronically Signed   By: Esperanza Heir M.D.   On: 05/06/2013 22:37    EKG Interpretation   None       MDM   1. Finger infection    Patient with worsening finger infection. Has soft but distended finger tip with dark discoloration. Feel she needs hand expertise, will admit to medicine for IV abx and they state they will consult hand in the AM    Audree Camel, MD 05/07/13 0009

## 2013-05-06 NOTE — ED Notes (Signed)
Bed: RESB Expected date:  Expected time:  Means of arrival:  Comments: EMS 79 yo finger swelling and discoloration

## 2013-05-07 DIAGNOSIS — I10 Essential (primary) hypertension: Secondary | ICD-10-CM | POA: Diagnosis present

## 2013-05-07 DIAGNOSIS — D72829 Elevated white blood cell count, unspecified: Secondary | ICD-10-CM

## 2013-05-07 DIAGNOSIS — L0390 Cellulitis, unspecified: Secondary | ICD-10-CM | POA: Diagnosis present

## 2013-05-07 DIAGNOSIS — L0291 Cutaneous abscess, unspecified: Secondary | ICD-10-CM

## 2013-05-07 DIAGNOSIS — F0390 Unspecified dementia without behavioral disturbance: Secondary | ICD-10-CM

## 2013-05-07 DIAGNOSIS — L089 Local infection of the skin and subcutaneous tissue, unspecified: Secondary | ICD-10-CM

## 2013-05-07 DIAGNOSIS — E039 Hypothyroidism, unspecified: Secondary | ICD-10-CM

## 2013-05-07 LAB — BASIC METABOLIC PANEL
BUN: 18 mg/dL (ref 6–23)
CO2: 27 mEq/L (ref 19–32)
Calcium: 8.8 mg/dL (ref 8.4–10.5)
Creatinine, Ser: 0.83 mg/dL (ref 0.50–1.10)
GFR calc Af Amer: 67 mL/min — ABNORMAL LOW (ref >90)
GFR calc non Af Amer: 58 mL/min — ABNORMAL LOW (ref >90)
Glucose, Bld: 126 mg/dL — ABNORMAL HIGH (ref 70–99)
Potassium: 3.4 mEq/L — ABNORMAL LOW (ref 3.5–5.1)

## 2013-05-07 LAB — CBC
Hemoglobin: 10.8 g/dL — ABNORMAL LOW (ref 12.0–15.0)
MCH: 30.3 pg (ref 26.0–34.0)
MCHC: 32.7 g/dL (ref 30.0–36.0)
MCV: 92.7 fL (ref 78.0–100.0)
RDW: 13.3 % (ref 11.5–15.5)

## 2013-05-07 LAB — TSH: TSH: 2.528 u[IU]/mL (ref 0.350–4.500)

## 2013-05-07 MED ORDER — HYDRALAZINE HCL 20 MG/ML IJ SOLN
10.0000 mg | Freq: Four times a day (QID) | INTRAMUSCULAR | Status: DC | PRN
  Administered 2013-05-07: 10 mg via INTRAVENOUS
  Filled 2013-05-07: qty 0.5

## 2013-05-07 MED ORDER — DOXYCYCLINE HYCLATE 100 MG PO TABS: 100.0000 mg | ORAL_TABLET | Freq: Two times a day (BID) | ORAL | Status: DC

## 2013-05-07 MED ORDER — VANCOMYCIN HCL IN DEXTROSE 1-5 GM/200ML-% IV SOLN: 1000.0000 mg | INTRAVENOUS | Status: DC

## 2013-05-07 MED ORDER — ENOXAPARIN SODIUM 30 MG/0.3ML ~~LOC~~ SOLN
30.0000 mg | SUBCUTANEOUS | Status: DC
  Administered 2013-05-07: 30 mg via SUBCUTANEOUS
  Filled 2013-05-07: qty 0.3

## 2013-05-07 NOTE — Progress Notes (Signed)
Assessment unchanged. Denies pain. Bandage clean, dry and intact to lt ring finger. Envelope given to son, Leonette Most, as prepared by Child psychotherapist to be presented to staff at [facility] upon arrival. Discharged via wc to front entrance to meet awaiting vehicle to carry to [facility] Accompanied by NT and son.

## 2013-05-07 NOTE — Progress Notes (Signed)
Clinical Social Work Department BRIEF PSYCHOSOCIAL ASSESSMENT 05/07/2013  Patient:  MARBETH, SMEDLEY     Account Number:  [id number]     Admit date:  05/06/2013  Clinical Social Worker:  Doroteo Glassman  Date/Time:  05/07/2013 11:37 AM  Referred by:  Physician  Date Referred:  05/07/2013 Referred for  ALF Placement   Other Referral:   Interview type:  Other - See comment Other interview type:   Pt's son via phone    PSYCHOSOCIAL DATA Living Status:  FACILITY Admitted from facility:  [facility] Level of care:  Assisted Living Primary support name:  Marcello Moores Primary support relationship to patient:  CHILD, ADULT Degree of support available:   strong    CURRENT CONCERNS Current Concerns  Post-Acute Placement   Other Concerns:    SOCIAL WORK ASSESSMENT / PLAN Per MD, Pt ready for d/c.    Contacted Pt's son via phone and notified him of Pt's d/c today.  Pt's son stated that Pt is from [facility] and that he will pick her up today and take her back there.    CSW thanked Mr. Mcpeters for his time.    Notified Christy at facility of Pt's return.  Faxed d/c summary.  FL2 not needed, as Pt has been in the hospital less than 24 hrs.    Facility ready to recieve Pt.    No further CSW needs identified.    CSW to sign off.   Assessment/plan status:  No Further Intervention Required Other assessment/ plan:   Information/referral to community resources:   n/a    PATIENT'S/FAMILY'S RESPONSE TO PLAN OF CARE: Pt's son was astounded that Pt was admitted for the swelling on Pt's finger.  He was happy for her to return to [facility] and happily provided transportation for her.    Pt's son thanked CSW for time and assistance.   Providence Crosby, LCSWA Clinical Social Work [phone]

## 2013-05-07 NOTE — Consult Note (Signed)
Reason for Consult:Infection Referring Physician: Hospitalist  CC:My finger hurts  HPI: Ms  Linda Mccullough is an 79 y.o. right handed female admitted for L 4th finger swelling, infection with swelling and redness up hand and arm.  Details per patient are vague (age).  Some discomfort.  Past Medical History  Diagnosis Date  . Dementia   . Hypothyroid   . Arthritis   . Hypertension   . GERD (gastroesophageal reflux disease)   . Diverticulitis     History reviewed. No pertinent past surgical history.  History reviewed. No pertinent family history.  Social History:  reports that she has never smoked. She does not have any smokeless tobacco history on file. She reports that she does not drink alcohol or use illicit drugs.  Allergies:  Allergies  Allergen Reactions  . Aricept [Donepezil Hcl]     Per mar  . Codeine     Per mar  . Hydrocodone     Per mar  . Meloxicam     Per mar  . Sulfa Antibiotics     Per mar    Medications: I have reviewed the patient's current medications.  Results for orders placed during the hospital encounter of 05/06/13 (from the past 48 hour(s))  CBC WITH DIFFERENTIAL     Status: Abnormal   Collection Time    05/06/13 10:45 PM      Result Value Range   WBC 12.4 (*) 4.0 - 10.5 K/uL   RBC 3.89  3.87 - 5.11 MIL/uL   Hemoglobin 11.7 (*) 12.0 - 15.0 g/dL   HCT 81.1  91.4 - 78.2 %   MCV 94.3  78.0 - 100.0 fL   MCH 30.1  26.0 - 34.0 pg   MCHC 31.9  30.0 - 36.0 g/dL   RDW 95.6  21.3 - 08.6 %   Platelets 194  150 - 400 K/uL   Neutrophils Relative % 77  43 - 77 %   Neutro Abs 9.5 (*) 1.7 - 7.7 K/uL   Lymphocytes Relative 13  12 - 46 %   Lymphs Abs 1.7  0.7 - 4.0 K/uL   Monocytes Relative 9  3 - 12 %   Monocytes Absolute 1.1 (*) 0.1 - 1.0 K/uL   Eosinophils Relative 1  0 - 5 %   Eosinophils Absolute 0.1  0.0 - 0.7 K/uL   Basophils Relative 0  0 - 1 %   Basophils Absolute 0.0  0.0 - 0.1 K/uL  BASIC METABOLIC PANEL     Status: Abnormal   Collection  Time    05/06/13 10:45 PM      Result Value Range   Sodium 140  135 - 145 mEq/L   Potassium 5.2 (*) 3.5 - 5.1 mEq/L   Comment: MODERATE HEMOLYSIS     HEMOLYSIS AT THIS LEVEL MAY AFFECT RESULT   Chloride 105  96 - 112 mEq/L   CO2 27  19 - 32 mEq/L   Glucose, Bld 112 (*) 70 - 99 mg/dL   BUN 23  6 - 23 mg/dL   Creatinine, Ser 5.78  0.50 - 1.10 mg/dL   Calcium 9.3  8.4 - 46.9 mg/dL   GFR calc non Af Amer 51 (*) >90 mL/min   GFR calc Af Amer 59 (*) >90 mL/min   Comment: (NOTE)     The eGFR has been calculated using the CKD EPI equation.     This calculation has not been validated in all clinical situations.     eGFR's  persistently <90 mL/min signify possible Chronic Kidney     Disease.  SEDIMENTATION RATE     Status: Abnormal   Collection Time    05/06/13 10:45 PM      Result Value Range   Sed Rate 35 (*) 0 - 22 mm/hr  CG4 I-STAT (LACTIC ACID)     Status: None   Collection Time    05/06/13 10:52 PM      Result Value Range   Lactic Acid, Venous 0.94  0.5 - 2.2 mmol/L  C-REACTIVE PROTEIN     Status: Abnormal   Collection Time    05/06/13 11:00 PM      Result Value Range   CRP 0.9 (*) <0.60 mg/dL   Comment: Performed at [facility]  BASIC METABOLIC PANEL     Status: Abnormal   Collection Time    05/07/13  6:04 AM      Result Value Range   Sodium 140  135 - 145 mEq/L   Potassium 3.4 (*) 3.5 - 5.1 mEq/L   Comment: DELTA CHECK NOTED   Chloride 107  96 - 112 mEq/L   CO2 27  19 - 32 mEq/L   Glucose, Bld 126 (*) 70 - 99 mg/dL   BUN 18  6 - 23 mg/dL   Creatinine, Ser 7.82  0.50 - 1.10 mg/dL   Calcium 8.8  8.4 - 95.6 mg/dL   GFR calc non Af Amer 58 (*) >90 mL/min   GFR calc Af Amer 67 (*) >90 mL/min   Comment: (NOTE)     The eGFR has been calculated using the CKD EPI equation.     This calculation has not been validated in all clinical situations.     eGFR's persistently <90 mL/min signify possible Chronic Kidney     Disease.  CBC     Status: Abnormal   Collection  Time    05/07/13  6:04 AM      Result Value Range   WBC 11.0 (*) 4.0 - 10.5 K/uL   RBC 3.56 (*) 3.87 - 5.11 MIL/uL   Hemoglobin 10.8 (*) 12.0 - 15.0 g/dL   HCT 21.3 (*) 08.6 - 57.8 %   MCV 92.7  78.0 - 100.0 fL   MCH 30.3  26.0 - 34.0 pg   MCHC 32.7  30.0 - 36.0 g/dL   RDW 46.9  62.9 - 52.8 %   Platelets 183  150 - 400 K/uL  MRSA PCR SCREENING     Status: Abnormal   Collection Time    05/07/13  6:16 AM      Result Value Range   MRSA by PCR POSITIVE (*) NEGATIVE   Comment:            The GeneXpert MRSA Assay (FDA     approved for NASAL specimens     only), is one component of a     comprehensive MRSA colonization     surveillance program. It is not     intended to diagnose MRSA     infection nor to guide or     monitor treatment for     MRSA infections.     RESULT CALLED TO, READ BACK BY AND VERIFIED WITH:     Clarice Pole [id number] @ 0810 BY J SCOTTON    Dg Hand Complete Left  05/06/2013   CLINICAL DATA:  Swelling and color changes of 4th finger left hand, today turning black possible spider bite  EXAM: LEFT HAND - COMPLETE 3+  VIEW  FINDINGS: Diffuse arthropathy. No fracture or dislocation. No periosteal reaction or cortical destruction.  IMPRESSION: Diffuse arthropathy throughout the fingers with no discordant focal abnormalities involving the 4th digit.   Electronically Signed   By: Esperanza Heir M.D.   On: 05/06/2013 22:37    Pertinent items are noted in HPI. Temp:  [98.3 F (36.8 C)-98.9 F (37.2 C)] 98.3 F (36.8 C) (11/09 0600) Pulse Rate:  [62-93] 73 (11/09 0600) Resp:  [16] 16 (11/09 0600) BP: (113-181)/(64-86) 113/83 mmHg (11/09 0600) SpO2:  [94 %-100 %] 94 % (11/09 0600) Weight:  [58.4 kg (128 lb 12 oz)] 58.4 kg (128 lb 12 oz) (11/09 0100) General appearance: alert and cooperative Resp: clear to auscultation bilaterally Cardio: regular rate and rhythm GI: soft, non-tender; bowel sounds normal; no masses,  no organomegaly Extremities: extremities normal,  atraumatic, no cyanosis or edema and except for LUE with abscess 4th finger, with proximal cellulitis,    Assessment: Cellulitis, abscess Left 4th finger Plan: Will drain at bedside, cont abx, wound care  Bion Todorov CHRISTOPHER 05/07/2013, 9:12 AM

## 2013-05-07 NOTE — Discharge Summary (Signed)
Physician Discharge Summary  Odeal Welden ZOX:[id number] DOB: [birth year] DOA: 05/06/2013  PCP: Deforest Hoyles, MD  Admit date: 05/06/2013 Discharge date: 05/07/2013  Time spent: 45 minutes  Recommendations for Outpatient Follow-up:  -Will be discharged back to SNF today in stable condition.   Discharge Diagnoses:  Principal Problem:   Cellulitis Active Problems:   Hypertension   Hypothyroid   Dementia   Leukocytosis   Discharge Condition: Stable and improved  Filed Weights   05/07/13 0100  Weight: 58.4 kg (128 lb 12 oz)    History of present illness:  Linda Mccullough is a 79 y.o. female SNF resident who presents to the ED with complaints of pain, redness, and swelling of the Left Ring finger. She is unable to give a history due to her Dementia. Per medical records and the EDP the condition was noticed today with blackness in the finger tip and redness and swelling which is extending into her wrist and arm. It was thought that this could have been a spider bite and she was sent to the ED for evaluation. She has not had fevers or chills and no known injury. An X-Ray was done in the ED and was found to be negative for fracture, and she was placed on IV Vancomycin to cover a cellulitis and then she was referred for medical admission.    Hospital Course:   Cellulitis of left fourth ring finger -Has been seen in consultation by Dr. Izora Ribas with hand surgery. -He has drained at bedside and believes patient can be discharged back to SNF today with PO antibiotics. -Does not need to follow up with him unless wound fails to heal. -Will DC on doxycycline 100 mg PO BID for 14 days.  Rest of chronic medical conditions have been stable this hospitalization and his home medications have not been altered.  Procedures:  Bedside I and D on 05/07/2013 by Dr. Izora Ribas.   Consultations:  Ortho, Dr. Izora Ribas  Discharge Instructions  Discharge Orders   Future Orders Complete By Expires   Discontinue IV   As directed    Increase activity slowly  As directed        Medication List         acetaminophen 500 MG tablet  Commonly known as:  TYLENOL  Take 500-1,000 mg by mouth See admin instructions. Take 1 tablet at 9a and 9p (scheduled doses) and 2 tablets every 4 hours as needed for pain **Max 3000mg  per day**     cholecalciferol 1000 UNITS tablet  Commonly known as:  VITAMIN D  Take 2,000 Units by mouth every morning.     doxycycline 100 MG tablet  Commonly known as:  VIBRA-TABS  Take 1 tablet (100 mg total) by mouth 2 (two) times daily. For 14 days     ibuprofen 400 MG tablet  Commonly known as:  ADVIL,MOTRIN  Take 400 mg by mouth every 8 (eight) hours as needed for pain.     levothyroxine 50 MCG tablet  Commonly known as:  SYNTHROID, LEVOTHROID  Take 50 mcg by mouth daily before breakfast.     LORazepam 0.5 MG tablet  Commonly known as:  ATIVAN  Take 0.5 mg by mouth daily at 12 noon.     LORazepam 0.5 MG tablet  Commonly known as:  ATIVAN  Take 0.5 mg by mouth daily at 12 noon.     mupirocin cream 2 %  Commonly known as:  BACTROBAN  Apply 1 application topically 3 (three) times daily. For 5 days  ending 05/06/13     omega-3 acid ethyl esters 1 G capsule  Commonly known as:  LOVAZA  Take 1 g by mouth every morning.     omeprazole 20 MG capsule  Commonly known as:  PRILOSEC  Take 40 mg by mouth daily before breakfast.     QUEtiapine 25 MG tablet  Commonly known as:  SEROQUEL  Take 12.5 mg by mouth at bedtime.     senna 8.6 MG tablet  Commonly known as:  SENOKOT  Take 1 tablet by mouth every morning.       Allergies  Allergen Reactions  . Aricept [Donepezil Hcl]     Per mar  . Codeine     Per mar  . Hydrocodone     Per mar  . Meloxicam     Per mar  . Sulfa Antibiotics     Per mar      The results of significant diagnostics from this hospitalization (including imaging, microbiology, ancillary and laboratory) are listed below for reference.     Significant Diagnostic Studies: Dg Hand Complete Left  05/06/2013   CLINICAL DATA:  Swelling and color changes of 4th finger left hand, today turning black possible spider bite  EXAM: LEFT HAND - COMPLETE 3+ VIEW  FINDINGS: Diffuse arthropathy. No fracture or dislocation. No periosteal reaction or cortical destruction.  IMPRESSION: Diffuse arthropathy throughout the fingers with no discordant focal abnormalities involving the 4th digit.   Electronically Signed   By: Esperanza Heir M.D.   On: 05/06/2013 22:37    Microbiology: Recent Results (from the past 240 hour(s))  MRSA PCR SCREENING     Status: Abnormal   Collection Time    05/07/13  6:16 AM      Result Value Range Status   MRSA by PCR POSITIVE (*) NEGATIVE Final   Comment:            The GeneXpert MRSA Assay (FDA     approved for NASAL specimens     only), is one component of a     comprehensive MRSA colonization     surveillance program. It is not     intended to diagnose MRSA     infection nor to guide or     monitor treatment for     MRSA infections.     RESULT CALLED TO, READ BACK BY AND VERIFIED WITH:     Clarice Pole [id number] @ 0810 BY J SCOTTON     Labs: Basic Metabolic Panel:  Recent Labs Lab 05/06/13 2245 05/07/13 0604  NA 140 140  K 5.2* 3.4*  CL 105 107  CO2 27 27  GLUCOSE 112* 126*  BUN 23 18  CREATININE 0.92 0.83  CALCIUM 9.3 8.8   Liver Function Tests: No results found for this basename: AST, ALT, ALKPHOS, BILITOT, PROT, ALBUMIN,  in the last 168 hours No results found for this basename: LIPASE, AMYLASE,  in the last 168 hours No results found for this basename: AMMONIA,  in the last 168 hours CBC:  Recent Labs Lab 05/06/13 2245 05/07/13 0604  WBC 12.4* 11.0*  NEUTROABS 9.5*  --   HGB 11.7* 10.8*  HCT 36.7 33.0*  MCV 94.3 92.7  PLT 194 183   Cardiac Enzymes: No results found for this basename: CKTOTAL, CKMB, CKMBINDEX, TROPONINI,  in the last 168 hours BNP: BNP (last 3  results) No results found for this basename: PROBNP,  in the last 8760 hours CBG: No results found for this basename:  GLUCAP,  in the last 168 hours     Signed:  Chaya Jan  [facility] Pager: [phone] 05/07/2013, 9:54 AM

## 2013-05-07 NOTE — H&P (Addendum)
[facility] History and Physical  Sarya Linenberger YNW:[id number] DOB: [birth year] DOA: 05/06/2013  Referring physician: EDP PCP: Deforest Hoyles, MD  Specialists:   Chief Complaint: Redness Swelling and Pain in Left Hand  HPI: Linda Mccullough is a 79 y.o. female SNF resident who presents to the ED with complaints of pain, redness, and swelling of the Left Ring finger.  She is unable to give a history due to her Dementia.  Per medical records and the EDP the condition was noticed today with blackness in the finger tip and redness and swelling which is extending into her wrist and arm.   It was thought that this could have been a spider bite and she was sent to the ED for evaluation.   She has not had fevers or chills and no known injury.  An X-Ray was done in the ED and was found to be negative for fracture, and she was placed on IV Vancomycin to cover a cellulitis and then she was referred for medical admission.      Review of Systems: Unable to Obtain from the patient  Past Medical History  Diagnosis Date  . Dementia   . Hypothyroid   . Arthritis   . Hypertension   . GERD (gastroesophageal reflux disease)   . Diverticulitis     History reviewed. No pertinent past surgical history.   Prior to Admission medications   Medication Sig Start Date End Date Taking? Authorizing Provider  acetaminophen (TYLENOL) 500 MG tablet Take 500-1,000 mg by mouth See admin instructions. Take 1 tablet at 9a and 9p (scheduled doses) and 2 tablets every 4 hours as needed for pain **Max 3000mg  per day**   Yes Historical Provider, MD  cholecalciferol (VITAMIN D) 1000 UNITS tablet Take 2,000 Units by mouth every morning.   Yes Historical Provider, MD  ibuprofen (ADVIL,MOTRIN) 400 MG tablet Take 400 mg by mouth every 8 (eight) hours as needed for pain.   Yes Historical Provider, MD  levothyroxine (SYNTHROID, LEVOTHROID) 50 MCG tablet Take 50 mcg by mouth daily before breakfast.    Yes Historical Provider, MD   LORazepam (ATIVAN) 0.5 MG tablet Take 0.5 mg by mouth daily at 12 noon.   Yes Historical Provider, MD  LORazepam (ATIVAN) 0.5 MG tablet Take 0.5 mg by mouth daily at 12 noon.   Yes Historical Provider, MD  mupirocin cream (BACTROBAN) 2 % Apply 1 application topically 3 (three) times daily. For 5 days ending 05/06/13   Yes Historical Provider, MD  omega-3 acid ethyl esters (LOVAZA) 1 G capsule Take 1 g by mouth every morning.    Yes Historical Provider, MD  omeprazole (PRILOSEC) 20 MG capsule Take 40 mg by mouth daily before breakfast.    Yes Historical Provider, MD  QUEtiapine (SEROQUEL) 25 MG tablet Take 12.5 mg by mouth at bedtime.   Yes Historical Provider, MD  senna (SENOKOT) 8.6 MG tablet Take 1 tablet by mouth every morning.    Yes Historical Provider, MD    Allergies  Allergen Reactions  . Aricept [Donepezil Hcl]     Per mar  . Codeine     Per mar  . Hydrocodone     Per mar  . Meloxicam     Per mar  . Sulfa Antibiotics     Per mar     Social History:  reports that she has never smoked. She does not have any smokeless tobacco history on file. She reports that she does not drink alcohol or use illicit drugs.  Family History:  Unable to Obtain from the Patient   Physical Exam:  GEN:  Pleasant and Confused but Happy  79 y.o.  Elderly Caucasian female  examined  and in no acute distress; cooperative with exam Filed Vitals:   05/06/13 2137 05/06/13 2138 05/07/13 0003 05/07/13 0031  BP:  181/67 171/86 176/78  Pulse:  62 65 76  Temp:  98.6 F (37 C)    TempSrc:  Oral    Resp:  16    SpO2: 95% 97% 99% 95%   Blood pressure 176/78, pulse 76, temperature 98.6 F (37 C), temperature source Oral, resp. rate 16, SpO2 95.00%. PSYCH: She is alert and oriented x2; does not appear anxious does not appear depressed; affect is normal HEENT: Normocephalic and Atraumatic, Mucous membranes pink; PERRLA; EOM intact; Fundi:  Benign;  No scleral icterus, Nares: Patent, Oropharynx:  Clear, Edentulous with Dentures Present, Neck:  FROM, no cervical lymphadenopathy nor thyromegaly or carotid bruit; no JVD; Breasts:: Not examined CHEST WALL: No tenderness CHEST: Normal respiration, clear to auscultation bilaterally HEART: Regular rate and rhythm; no murmurs rubs or gallops BACK: No kyphosis or scoliosis; no CVA tenderness ABDOMEN: Positive Bowel Sounds, soft non-tender; no masses, no organomegaly EXTREMITIES: + Ecchymosis of DIP and erythema and EDEMA of Left 4th  Finger with erythema extending to the Dorsum of the Right hand;  Age appropriate arthropathy of the hands and knees; no cyanosis, clubbing or edema; no ulcerations. Genitalia: not examined PULSES: 2+ and symmetric SKIN: Normal hydration no rash or ulceration CNS: Cranial nerves 2-12 grossly intact no focal neurologic deficit    Labs on Admission:  Basic Metabolic Panel:  Recent Labs Lab 05/06/13 2245  NA 140  K 5.2*  CL 105  CO2 27  GLUCOSE 112*  BUN 23  CREATININE 0.92  CALCIUM 9.3   Liver Function Tests: No results found for this basename: AST, ALT, ALKPHOS, BILITOT, PROT, ALBUMIN,  in the last 168 hours No results found for this basename: LIPASE, AMYLASE,  in the last 168 hours No results found for this basename: AMMONIA,  in the last 168 hours CBC:  Recent Labs Lab 05/06/13 2245  WBC 12.4*  NEUTROABS 9.5*  HGB 11.7*  HCT 36.7  MCV 94.3  PLT 194   Cardiac Enzymes: No results found for this basename: CKTOTAL, CKMB, CKMBINDEX, TROPONINI,  in the last 168 hours  BNP (last 3 results) No results found for this basename: PROBNP,  in the last 8760 hours CBG: No results found for this basename: GLUCAP,  in the last 168 hours  Radiological Exams on Admission: Dg Hand Complete Left  05/06/2013   CLINICAL DATA:  Swelling and color changes of 4th finger left hand, today turning black possible spider bite  EXAM: LEFT HAND - COMPLETE 3+ VIEW  FINDINGS: Diffuse arthropathy. No fracture or  dislocation. No periosteal reaction or cortical destruction.  IMPRESSION: Diffuse arthropathy throughout the fingers with no discordant focal abnormalities involving the 4th digit.   Electronically Signed   By: Esperanza Heir M.D.   On: 05/06/2013 22:37       Assessment/Plan Principal Problem:   Cellulitis Active Problems:   Hypertension   Hypothyroid   Leukocytosis   Dementia    1.   Cellulitis- IV  Vancomycin and monitor for further progression, may need evaluation by Ortho/Hand.    2.  HTN-   IV Hydralazine PRN.  Monitor BPs.    3.  Hypothyroid- continue Levothyroxine, check TSH.    4.  Leukocytosis-  Due to #1,  Monitor Trend.    5.  Dementia- stable.   Continue on  Seroquel Rx and had Ativan PRN.    6.  DVT prophylaxis with Lovenox.        Code Status:   DO NOT RESUSCITATE Family Communication:    No Family present Disposition Plan:     Inpatient  Time spent: 30 Minutes  Ron Parker [facility] Pager [phone]  If 7PM-7AM, please contact night-coverage [email or web address] Password [facility] 05/07/2013, 12:32 AM

## 2013-05-07 NOTE — Progress Notes (Signed)
CRITICAL VALUE ALERT  Critical value received:  +MRSA PCR screen  Date of notification:  11/9//204  Time of notification:  0810  Critical value read back:yes  Nurse who received alert:  Alda Berthold, RN  MD notified (1st page):  Dr. Ardyth Harps  Time of first page:  0825  MD notified (2nd page):  Time of second page:  Responding MD:  Dr. Ardyth Harps  Time MD responded:  [phone] - spoke with MD on am rounds

## 2013-05-07 NOTE — Progress Notes (Signed)
PHARMACY - LOVENOX  Lovenox 40mg  sq q24h ordered for VTE prophylaxis.  Pharmacy asked to adjust dose if necessary.  79 yr female with TBW = 58.4 kg and CrCl ~ 28 ml/min  As CrCl < 30 ml/min, will adjust Lovenox dose  PLAN:  Change Lovenox to 30mg  sq q24h  Thank you, Terrilee Files, PharmD

## 2013-05-07 NOTE — Progress Notes (Signed)
ANTIBIOTIC CONSULT NOTE - INITIAL  Pharmacy Consult for Vancomycin Indication: Cellulitis  Allergies  Allergen Reactions  . Aricept [Donepezil Hcl]     Per mar  . Codeine     Per mar  . Hydrocodone     Per mar  . Meloxicam     Per mar  . Sulfa Antibiotics     Per mar    Patient Measurements: Height: 5\' 2"  (157.5 cm) Weight: 128 lb 12 oz (58.4 kg) IBW/kg (Calculated) : 50.1  Vital Signs: Temp: 98.9 F (37.2 C) (11/09 0100) Temp src: Oral (11/09 0100) BP: 122/64 mmHg (11/09 0100) Pulse Rate: 93 (11/09 0100) Intake/Output from previous day: 11/08 0701 - 11/09 0700 In: -  Out: 425 [Urine:425] Intake/Output from this shift: Total I/O In: -  Out: 425 [Urine:425]  Labs:  Recent Labs  05/06/13 2245  WBC 12.4*  HGB 11.7*  PLT 194  CREATININE 0.92   Estimated Creatinine Clearance: 28.3 ml/min (by C-G formula based on Cr of 0.92). No results found for this basename: VANCOTROUGH, VANCOPEAK, VANCORANDOM, GENTTROUGH, GENTPEAK, GENTRANDOM, TOBRATROUGH, TOBRAPEAK, TOBRARND, AMIKACINPEAK, AMIKACINTROU, AMIKACIN,  in the last 72 hours   Microbiology: No results found for this or any previous visit (from the past 720 hour(s)).  Medical History: Past Medical History  Diagnosis Date  . Dementia   . Hypothyroid   . Arthritis   . Hypertension   . GERD (gastroesophageal reflux disease)   . Diverticulitis     Medications:  Scheduled:  . cholecalciferol  2,000 Units Oral q morning - 10a  . enoxaparin (LOVENOX) injection  40 mg Subcutaneous Q24H  . levothyroxine  50 mcg Oral QAC breakfast  . LORazepam  0.5 mg Oral Q1200  . omega-3 acid ethyl esters  1 g Oral q morning - 10a  . pantoprazole  40 mg Oral Daily  . QUEtiapine  12.5 mg Oral QHS  . senna  1 tablet Oral q morning - 10a   Infusions:  . sodium chloride 75 mL/hr at 05/07/13 0007   Assessment:  79 yr female with complaint of redness, pain and swelling of finger.  Tip of finger noted to have turned black  today  Suspect possible spider bite  IV Vancomycin begun in ED and to be continued upon admission for treatment of cellulitis  Goal of Therapy:  Vancomycin trough level 10-15 mcg/ml  Plan:  Measure antibiotic drug levels at steady state Follow up culture results Vancomycin 1gm IV q48h  Charmian Forbis, Joselyn Glassman, PharmD 05/07/2013,2:36 AM

## 2013-05-07 NOTE — Progress Notes (Signed)
Report given to Winifred Olive, Med Tech, at [facility] regarding pt status. Christy reported no nurse on duty at present time to give report. Made aware pt's son here to transport pt back to facility. Understanding verbalized.

## 2013-07-04 ENCOUNTER — Encounter (HOSPITAL_COMMUNITY): Payer: Self-pay | Admitting: Emergency Medicine

## 2013-07-04 ENCOUNTER — Emergency Department (HOSPITAL_COMMUNITY): Payer: PRIVATE HEALTH INSURANCE

## 2013-07-04 ENCOUNTER — Emergency Department (HOSPITAL_COMMUNITY)
Admission: EM | Admit: 2013-07-04 | Discharge: 2013-07-04 | Disposition: A | Discharge status: Skilled Nursing Facility | Payer: PRIVATE HEALTH INSURANCE | Attending: Emergency Medicine | Admitting: Emergency Medicine

## 2013-07-04 DIAGNOSIS — S1093XA Contusion of unspecified part of neck, initial encounter: Principal | ICD-10-CM

## 2013-07-04 DIAGNOSIS — W19XXXA Unspecified fall, initial encounter: Secondary | ICD-10-CM

## 2013-07-04 DIAGNOSIS — S0083XA Contusion of other part of head, initial encounter: Principal | ICD-10-CM

## 2013-07-04 DIAGNOSIS — S0003XA Contusion of scalp, initial encounter: Secondary | ICD-10-CM | POA: Insufficient documentation

## 2013-07-04 DIAGNOSIS — S199XXA Unspecified injury of neck, initial encounter: Secondary | ICD-10-CM

## 2013-07-04 DIAGNOSIS — F0390 Unspecified dementia without behavioral disturbance: Secondary | ICD-10-CM | POA: Insufficient documentation

## 2013-07-04 DIAGNOSIS — W1809XA Striking against other object with subsequent fall, initial encounter: Secondary | ICD-10-CM | POA: Insufficient documentation

## 2013-07-04 DIAGNOSIS — S0093XA Contusion of unspecified part of head, initial encounter: Secondary | ICD-10-CM

## 2013-07-04 DIAGNOSIS — I10 Essential (primary) hypertension: Secondary | ICD-10-CM | POA: Insufficient documentation

## 2013-07-04 DIAGNOSIS — Y9210 Unspecified residential institution as the place of occurrence of the external cause: Secondary | ICD-10-CM | POA: Insufficient documentation

## 2013-07-04 DIAGNOSIS — Z98811 Dental restoration status: Secondary | ICD-10-CM | POA: Insufficient documentation

## 2013-07-04 DIAGNOSIS — M129 Arthropathy, unspecified: Secondary | ICD-10-CM | POA: Insufficient documentation

## 2013-07-04 DIAGNOSIS — E039 Hypothyroidism, unspecified: Secondary | ICD-10-CM | POA: Insufficient documentation

## 2013-07-04 DIAGNOSIS — S0993XA Unspecified injury of face, initial encounter: Secondary | ICD-10-CM | POA: Insufficient documentation

## 2013-07-04 DIAGNOSIS — Z79899 Other long term (current) drug therapy: Secondary | ICD-10-CM | POA: Insufficient documentation

## 2013-07-04 DIAGNOSIS — K219 Gastro-esophageal reflux disease without esophagitis: Secondary | ICD-10-CM | POA: Insufficient documentation

## 2013-07-04 DIAGNOSIS — Y939 Activity, unspecified: Secondary | ICD-10-CM | POA: Insufficient documentation

## 2013-07-04 LAB — BASIC METABOLIC PANEL
BUN: 28 mg/dL — ABNORMAL HIGH (ref 6–23)
CO2: 26 mEq/L (ref 19–32)
Calcium: 9.3 mg/dL (ref 8.4–10.5)
Chloride: 105 mEq/L (ref 96–112)
Creatinine, Ser: 1.26 mg/dL — ABNORMAL HIGH (ref 0.50–1.10)
GFR, EST AFRICAN AMERICAN: 40 mL/min — AB (ref >90)
GFR, EST NON AFRICAN AMERICAN: 35 mL/min — AB (ref >90)
Glucose, Bld: 109 mg/dL — ABNORMAL HIGH (ref 70–99)
Potassium: 4.7 mEq/L (ref 3.7–5.3)
Sodium: 144 mEq/L (ref 137–147)

## 2013-07-04 LAB — URINALYSIS, ROUTINE W REFLEX MICROSCOPIC
Bilirubin Urine: NEGATIVE
Glucose, UA: NEGATIVE mg/dL
Hgb urine dipstick: NEGATIVE
KETONES UR: NEGATIVE mg/dL
Leukocytes, UA: NEGATIVE
NITRITE: NEGATIVE
Protein, ur: NEGATIVE mg/dL
Specific Gravity, Urine: 1.016 (ref 1.005–1.030)
Urobilinogen, UA: 0.2 mg/dL (ref 0.0–1.0)
pH: 6.5 (ref 5.0–8.0)

## 2013-07-04 LAB — CBC
HEMATOCRIT: 32.3 % — AB (ref 36.0–46.0)
Hemoglobin: 10.3 g/dL — ABNORMAL LOW (ref 12.0–15.0)
MCH: 30.1 pg (ref 26.0–34.0)
MCHC: 31.9 g/dL (ref 30.0–36.0)
MCV: 94.4 fL (ref 78.0–100.0)
Platelets: 241 10*3/uL (ref 150–400)
RBC: 3.42 MIL/uL — ABNORMAL LOW (ref 3.87–5.11)
RDW: 13.4 % (ref 11.5–15.5)
WBC: 8 10*3/uL (ref 4.0–10.5)

## 2013-07-04 NOTE — ED Notes (Signed)
Called for transport back to nursing facility

## 2013-07-04 NOTE — ED Notes (Signed)
Pt BIB EMS. Pt is from [facility]. Pt fell twice today. Pt fell the second time in the BR and hit her head. Pt has a hematoma to the back of her head per EMS. Pt is a DNR. Pt c/o neck pain to EMS and pt arrives with c-collar in place. Pt is alert to baseline per EMS. Pt denies n/v. Pt with no acute distress.

## 2013-07-04 NOTE — Discharge Instructions (Signed)
Call for a follow up appointment with a Family or Primary Care Provider.  °Return if Symptoms worsen.   °Take medication as prescribed.  ° °

## 2013-07-04 NOTE — ED Provider Notes (Signed)
CSN: [id number]     Arrival date & time 07/04/13  1645 History   First MD Initiated Contact with Patient 07/04/13 1646     Chief Complaint  Patient presents with  . Fall   (Consider location/radiation/quality/duration/timing/severity/associated sxs/prior Treatment) HPI Comments: Linda Mccullough is a 80 y.o. year-old female with a past medical history of dementia, HTN, presenting the Emergency Department with a chief complaint of fall. The patient is a resident at [facility]. Per EMS reports that patient fell twice today, the second time in the bathroom hitting her head on the bathroom floor.  She reports headache occipital headache and neck pain.  In the ED she denies neck pain but reports occipital headache. Level V caveat applies.  DRN on chart. PCP: Deforest Hoyles, MD    The history is provided by the patient, medical records and the EMS personnel. No language interpreter was used.    Past Medical History  Diagnosis Date  . Dementia   . Hypothyroid   . Arthritis   . Hypertension   . GERD (gastroesophageal reflux disease)   . Diverticulitis    History reviewed. No pertinent past surgical history. No family history on file. History  Substance Use Topics  . Smoking status: Never Smoker   . Smokeless tobacco: Not on file  . Alcohol Use: No   OB History   Grav Para Term Preterm Abortions TAB SAB Ect Mult Living                 Review of Systems  Unable to perform ROS: Dementia    Allergies  Aricept; Codeine; Hydrocodone; Meloxicam; and Sulfa antibiotics  Home Medications   Current Outpatient Rx  Name  Route  Sig  Dispense  Refill  . acetaminophen (TYLENOL) 500 MG tablet   Oral   Take 1,000 mg by mouth daily.          Marland Kitchen acetaminophen (TYLENOL) 500 MG tablet   Oral   Take 1,000 mg by mouth every 4 (four) hours as needed for mild pain or moderate pain. NTE 3000mg  in 24 hours         . Cholecalciferol (VITAMIN D3) 2000 UNITS capsule  Oral   Take 2,000 Units by mouth daily.         . divalproex (DEPAKOTE SPRINKLE) 125 MG capsule   Oral   Take 125 mg by mouth at bedtime.         Marland Kitchen ibuprofen (ADVIL,MOTRIN) 400 MG tablet   Oral   Take 400 mg by mouth every 8 (eight) hours as needed for pain.         Marland Kitchen levothyroxine (SYNTHROID, LEVOTHROID) 50 MCG tablet   Oral   Take 50 mcg by mouth daily before breakfast.          . LORazepam (ATIVAN) 0.5 MG tablet   Oral   Take 0.5 mg by mouth daily. At noon         . LORazepam (ATIVAN) 0.5 MG tablet   Oral   Take 0.5 mg by mouth every 6 (six) hours as needed for anxiety (agitation).         . magnesium hydroxide (MILK OF MAGNESIA) 400 MG/5ML suspension   Oral   Take 30 mLs by mouth daily as needed for mild constipation.         Marland Kitchen omega-3 acid ethyl esters (LOVAZA) 1 G capsule   Oral   Take 1 g by mouth every morning.          Marland Kitchen  omeprazole (PRILOSEC) 20 MG capsule   Oral   Take 40 mg by mouth daily before breakfast.          . QUEtiapine (SEROQUEL) 25 MG tablet   Oral   Take 12.5 mg by mouth every 3 (three) days. Doses due on 07/05/13, 07/08/13, and 07/11/13. Then STOP.         Marland Kitchen senna (SENOKOT) 8.6 MG tablet   Oral   Take 1 tablet by mouth every morning.          Marland Kitchen spironolactone (ALDACTONE) 25 MG tablet   Oral   Take 12.5 mg by mouth daily. For 28 days. Stop after 07/11/13          BP 153/55  Pulse 61  Temp(Src) 98.2 F (36.8 C) (Oral)  Resp 18  SpO2 97% Physical Exam  Nursing note and vitals reviewed. Constitutional: She appears well-developed and well-nourished. No distress. Cervical collar in place.  Pleasant elderly female in NAD.  HENT:  Head: Normocephalic. Head is with contusion. Head is without raccoon's eyes, without Battle's sign and without laceration.    Nose: No nasal deformity. No epistaxis.  Mouth/Throat: Uvula is midline and oropharynx is clear and moist. She has dentures. No lacerations.  Eyes: EOM are normal.  Pupils are equal, round, and reactive to light.  Neck: Neck supple.  Abdominal: Soft. There is no tenderness. There is no rigidity and no guarding.  Musculoskeletal:  Able to move all 4 extremities without pain.  No pain with palpation of bilateral knee, hips, elbows, shoulders.  No midline C-spine, T-spine, or L-spine tenderness with no step-offs, crepitus, or deformities noted.   Neurological: She is alert. She is disoriented.  Disoriented to date, oriented to person and place.    ED Course  Procedures (including critical care time) Labs Review Labs Reviewed  CBC - Abnormal; Notable for the following:    RBC 3.42 (*)    Hemoglobin 10.3 (*)    HCT 32.3 (*)    All other components within normal limits  BASIC METABOLIC PANEL - Abnormal; Notable for the following:    Glucose, Bld 109 (*)    BUN 28 (*)    Creatinine, Ser 1.26 (*)    GFR calc non Af Amer 35 (*)    GFR calc Af Amer 40 (*)    All other components within normal limits  URINALYSIS, ROUTINE W REFLEX MICROSCOPIC - Abnormal; Notable for the following:    APPearance CLOUDY (*)    All other components within normal limits   Imaging Review Ct Head Wo Contrast  07/04/2013   CLINICAL DATA:  Fall.  EXAM: CT HEAD WITHOUT CONTRAST  CT CERVICAL SPINE WITHOUT CONTRAST  TECHNIQUE: Multidetector CT imaging of the head and cervical spine was performed following the standard protocol without intravenous contrast. Multiplanar CT image reconstructions of the cervical spine were also generated.  COMPARISON:  11/13/2012 plain film exam of the cervical spine. 11/13/2012 head CT. 09/09/2012 head CT and cervical spine CT.  FINDINGS: CT HEAD FINDINGS  No skull fracture or intracranial hemorrhage.  Small vessel disease type changes without CT evidence of large acute infarct.  Atrophy with ventricular prominence stable.  No intracranial mass lesion noted on this unenhanced exam.  Mucosal thickening paranasal sinuses  CT CERVICAL SPINE FINDINGS  No  cervical spine fracture.  Cystic changes of the dens.  Transverse ligament hypertrophy.  Minimal anterior slip of C2 felt to be related to facet joint degenerative changes greater on the right.  Various degrees of spinal stenosis and foraminal narrowing.  IMPRESSION: No skull fracture or intracranial hemorrhage.  No cervical spine fracture.  Please see above.   Electronically Signed   By: Bridgett LarssonSteve  Olson M.D.   On: 07/04/2013 18:06   Ct Cervical Spine Wo Contrast  07/04/2013   CLINICAL DATA:  Fall.  EXAM: CT HEAD WITHOUT CONTRAST  CT CERVICAL SPINE WITHOUT CONTRAST  TECHNIQUE: Multidetector CT imaging of the head and cervical spine was performed following the standard protocol without intravenous contrast. Multiplanar CT image reconstructions of the cervical spine were also generated.  COMPARISON:  11/13/2012 plain film exam of the cervical spine. 11/13/2012 head CT. 09/09/2012 head CT and cervical spine CT.  FINDINGS: CT HEAD FINDINGS  No skull fracture or intracranial hemorrhage.  Small vessel disease type changes without CT evidence of large acute infarct.  Atrophy with ventricular prominence stable.  No intracranial mass lesion noted on this unenhanced exam.  Mucosal thickening paranasal sinuses  CT CERVICAL SPINE FINDINGS  No cervical spine fracture.  Cystic changes of the dens.  Transverse ligament hypertrophy.  Minimal anterior slip of C2 felt to be related to facet joint degenerative changes greater on the right.  Various degrees of spinal stenosis and foraminal narrowing.  IMPRESSION: No skull fracture or intracranial hemorrhage.  No cervical spine fracture.  Please see above.   Electronically Signed   By: Bridgett LarssonSteve  Olson M.D.   On: 07/04/2013 18:06    EKG Interpretation    Date/Time:  Tuesday July 04 2013 18:10:32 EST Ventricular Rate:  60 PR Interval:  167 QRS Duration: 87 QT Interval:  427 QTC Calculation: 427 R Axis:   -50 Text Interpretation:  Sinus rhythm Ventricular premature complex Left  anterior fascicular block No significant change since last tracing Confirmed by DOCHERTY  MD, MEGAN ([id number]) on 07/04/2013 6:15:10 PM            MDM   1. Head contusion, initial encounter   2. Fall, initial encounter    Pt with dementia presents with a fall.  The patient is not anticoagulated but there is a small contusion noted to the occipital scalp. CT to verify and CT of neck due to previous pain at the scene.  Labs sent, EKG ordered. Discussed patient history, condition, and labs with Dr. Micheline Mazeocherty, after her evaluation of the patient agrees the patient can be evaluated as an out-pt.     Clabe SealLauren M Tedric Leeth, PA-C 07/05/13 [phone]

## 2013-07-06 NOTE — ED Provider Notes (Signed)
Medical screening examination/treatment/procedure(s) were conducted as a shared visit with non-physician practitioner(s) and myself.  I personally evaluated the patient during the encounter. Pt presents after mechanical fall at home.  VSS, pt in NAD.  Cardiopulm exam and neuro exam benign. CT head, c-spine unremarkable.  Pt safe for d/c.  EKG Interpretation    Date/Time:  Tuesday July 04 2013 18:10:32 EST Ventricular Rate:  60 PR Interval:  167 QRS Duration: 87 QT Interval:  427 QTC Calculation: 427 R Axis:   -50 Text Interpretation:  Sinus rhythm Ventricular premature complex Left anterior fascicular block No significant change since last tracing Confirmed by DOCHERTY  MD, MEGAN ([id number]) on 07/04/2013 6:15:10 PM              Shanna CiscoMegan E Docherty, MD 07/06/13 [phone]

## 2013-10-26 ENCOUNTER — Encounter (HOSPITAL_COMMUNITY): Payer: Self-pay | Admitting: Emergency Medicine

## 2013-10-26 ENCOUNTER — Emergency Department (HOSPITAL_COMMUNITY)
Admission: EM | Admit: 2013-10-26 | Discharge: 2013-10-26 | Disposition: A | Discharge status: Home / Self Care | Payer: PRIVATE HEALTH INSURANCE | Attending: Emergency Medicine | Admitting: Emergency Medicine

## 2013-10-26 DIAGNOSIS — M129 Arthropathy, unspecified: Secondary | ICD-10-CM | POA: Insufficient documentation

## 2013-10-26 DIAGNOSIS — Y9301 Activity, walking, marching and hiking: Secondary | ICD-10-CM | POA: Insufficient documentation

## 2013-10-26 DIAGNOSIS — F0390 Unspecified dementia without behavioral disturbance: Secondary | ICD-10-CM | POA: Insufficient documentation

## 2013-10-26 DIAGNOSIS — I10 Essential (primary) hypertension: Secondary | ICD-10-CM | POA: Insufficient documentation

## 2013-10-26 DIAGNOSIS — R296 Repeated falls: Secondary | ICD-10-CM | POA: Insufficient documentation

## 2013-10-26 DIAGNOSIS — K219 Gastro-esophageal reflux disease without esophagitis: Secondary | ICD-10-CM | POA: Insufficient documentation

## 2013-10-26 DIAGNOSIS — W19XXXA Unspecified fall, initial encounter: Secondary | ICD-10-CM

## 2013-10-26 DIAGNOSIS — Z79899 Other long term (current) drug therapy: Secondary | ICD-10-CM | POA: Insufficient documentation

## 2013-10-26 DIAGNOSIS — E039 Hypothyroidism, unspecified: Secondary | ICD-10-CM | POA: Insufficient documentation

## 2013-10-26 DIAGNOSIS — Y9210 Unspecified residential institution as the place of occurrence of the external cause: Secondary | ICD-10-CM | POA: Insufficient documentation

## 2013-10-26 LAB — BASIC METABOLIC PANEL
BUN: 22 mg/dL (ref 6–23)
CHLORIDE: 108 meq/L (ref 96–112)
CO2: 27 mEq/L (ref 19–32)
Calcium: 8.8 mg/dL (ref 8.4–10.5)
Creatinine, Ser: 1.01 mg/dL (ref 0.50–1.10)
GFR calc non Af Amer: 45 mL/min — ABNORMAL LOW (ref >90)
GFR, EST AFRICAN AMERICAN: 53 mL/min — AB (ref >90)
GLUCOSE: 88 mg/dL (ref 70–99)
Potassium: 4 mEq/L (ref 3.7–5.3)
Sodium: 146 mEq/L (ref 137–147)

## 2013-10-26 LAB — URINALYSIS, ROUTINE W REFLEX MICROSCOPIC
BILIRUBIN URINE: NEGATIVE
Glucose, UA: NEGATIVE mg/dL
HGB URINE DIPSTICK: NEGATIVE
KETONES UR: NEGATIVE mg/dL
Leukocytes, UA: NEGATIVE
NITRITE: NEGATIVE
PH: 6.5 (ref 5.0–8.0)
Protein, ur: NEGATIVE mg/dL
Specific Gravity, Urine: 1.011 (ref 1.005–1.030)
Urobilinogen, UA: 1 mg/dL (ref 0.0–1.0)

## 2013-10-26 NOTE — ED Notes (Signed)
Walked by patient room, patient sitting at end of bed and trying to get out of bed, stating "I'm going to go home". Sitter at bedside currently.

## 2013-10-26 NOTE — Discharge Instructions (Signed)

## 2013-10-26 NOTE — ED Provider Notes (Signed)
CSN: [id number]     Arrival date & time 10/26/13  1938 History   First MD Initiated Contact with Patient 10/26/13 1953     Chief Complaint  Patient presents with  . Fall     (Consider location/radiation/quality/duration/timing/severity/associated sxs/prior Treatment) Patient is a 80 y.o. female presenting with fall. The history is provided by the patient. No language interpreter was used.  Fall This is a new problem. The current episode started today. Associated symptoms comments: She arrives from the nursing home where she reportedly had a witnessed fall while walking with the walker. Per nursing home staff, she complained of hip and back pain. Here she denies any pain. She does not remember the fall. She has a history of dementia and is at baseline per report..    Past Medical History  Diagnosis Date  . Dementia   . Hypothyroid   . Arthritis   . Hypertension   . GERD (gastroesophageal reflux disease)   . Diverticulitis    History reviewed. No pertinent past surgical history. No family history on file. History  Substance Use Topics  . Smoking status: Never Smoker   . Smokeless tobacco: Not on file  . Alcohol Use: No   OB History   Grav Para Term Preterm Abortions TAB SAB Ect Mult Living                 Review of Systems  Unable to perform ROS: Dementia      Allergies  Aricept; Codeine; Hydrocodone; Meloxicam; and Sulfa antibiotics  Home Medications   Prior to Admission medications   Medication Sig Start Date End Date Taking? Authorizing Provider  acetaminophen (TYLENOL) 500 MG tablet Take 1,000 mg by mouth daily.    Yes Historical Provider, MD  acetaminophen (TYLENOL) 500 MG tablet Take 1,000 mg by mouth every 4 (four) hours as needed for mild pain or moderate pain. NTE 3000mg  in 24 hours   Yes Historical Provider, MD  Cholecalciferol (VITAMIN D3) 2000 UNITS capsule Take 2,000 Units by mouth daily.   Yes Historical Provider, MD  divalproex (DEPAKOTE SPRINKLE) 125  MG capsule Take 125 mg by mouth at bedtime.   Yes Historical Provider, MD  ibuprofen (ADVIL,MOTRIN) 400 MG tablet Take 400 mg by mouth every 8 (eight) hours as needed for pain.   Yes Historical Provider, MD  levothyroxine (SYNTHROID, LEVOTHROID) 50 MCG tablet Take 50 mcg by mouth daily before breakfast.    Yes Historical Provider, MD  LORazepam (ATIVAN) 0.5 MG tablet Take 0.5 mg by mouth every morning. Take one tablet in the morning and 1 tablet at 8pm.   Yes Historical Provider, MD  LORazepam (ATIVAN) 0.5 MG tablet Take 0.5 mg by mouth every evening.   Yes Historical Provider, MD  magnesium hydroxide (MILK OF MAGNESIA) 400 MG/5ML suspension Take 30 mLs by mouth daily as needed for mild constipation.   Yes Historical Provider, MD  Melatonin 5 MG CAPS Take 1 capsule by mouth daily.   Yes Historical Provider, MD  omega-3 acid ethyl esters (LOVAZA) 1 G capsule Take 1 g by mouth every morning.    Yes Historical Provider, MD  Omega-3 Fatty Acids (FISH OIL PO) Take 1 capsule by mouth daily.   Yes Historical Provider, MD  omeprazole (PRILOSEC) 20 MG capsule Take 40 mg by mouth daily before breakfast.    Yes Historical Provider, MD  senna (SENOKOT) 8.6 MG tablet Take 1 tablet by mouth every morning.    Yes Historical Provider, MD   BP  126/87  Pulse 69  Temp(Src) 97.8 F (36.6 C) (Oral)  Resp 16  SpO2 96% Physical Exam  Constitutional: She appears well-developed and well-nourished.  HENT:  Head: Normocephalic and atraumatic.  Eyes: Conjunctivae are normal.  Neck: Normal range of motion. Neck supple.  Cardiovascular: Normal rate and regular rhythm.   Pulmonary/Chest: Effort normal and breath sounds normal. She exhibits no tenderness.  Abdominal: Soft. Bowel sounds are normal. There is no tenderness. There is no rebound and no guarding.  Musculoskeletal: Normal range of motion. She exhibits no edema and no tenderness.  No midline cervical, thoracic or lumbar tenderness. No pelvic tenderness. FROM  LE's without complaint of pain.    Neurological: She is alert. She exhibits normal muscle tone. Coordination normal.  She is pleasantly demented with memory loss. Ambulatory with full weight bearing and normal coordination.   Skin: Skin is warm and dry. No rash noted.  Skin tear to right dorsal forearm that appears aged.  Psychiatric: She has a normal mood and affect.    ED Course  Procedures (including critical care time) Labs Review Labs Reviewed - No data to display  Imaging Review No results found.   EKG Interpretation None      MDM   Final diagnoses:  None    1. Fall 2. Dementia   She is very pleasant on exam, no tenderness, no complaint of any pain and ambulatory without difficulty or limitation. Stable for discharge.     Arnoldo HookerShari A Kaydyn Chism, PA-C 10/26/13 2101

## 2013-10-26 NOTE — ED Provider Notes (Signed)
  Face-to-face evaluation   History: She fell while walking with her walker.  Physical exam: Patient is alert, and is without complaint. Head and neck, appeared normal. She moves all extremities equally  Medical screening examination/treatment/procedure(s) were conducted as a shared visit with non-physician practitioner(s) and myself.  I personally evaluated the patient during the encounter  Flint MelterElliott L Megumi Treaster, MD 10/27/13 Moses [id number]

## 2013-10-26 NOTE — ED Notes (Signed)
Pt. Ambulated in hallway with walker. Pt. Ambulated with no problems. Denies pain at this time

## 2013-10-26 NOTE — ED Notes (Signed)
Per EMS, pt from nursing home where she had a witnessed fall while ambulating with walker. Pt. C/o right sided hip and knee pain and left sided back pain. Denies LOC or head trauma. Pt. Mental status appropriate at baseline.

## 2013-10-27 LAB — URINE CULTURE
CULTURE: NO GROWTH
Colony Count: NO GROWTH

## 2013-12-09 ENCOUNTER — Emergency Department (HOSPITAL_COMMUNITY): Payer: PRIVATE HEALTH INSURANCE

## 2013-12-09 ENCOUNTER — Emergency Department (HOSPITAL_COMMUNITY)
Admission: EM | Admit: 2013-12-09 | Discharge: 2013-12-09 | Disposition: A | Discharge status: Home / Self Care | Payer: PRIVATE HEALTH INSURANCE | Attending: Emergency Medicine | Admitting: Emergency Medicine

## 2013-12-09 DIAGNOSIS — I10 Essential (primary) hypertension: Secondary | ICD-10-CM | POA: Insufficient documentation

## 2013-12-09 DIAGNOSIS — W19XXXA Unspecified fall, initial encounter: Secondary | ICD-10-CM

## 2013-12-09 DIAGNOSIS — S0003XA Contusion of scalp, initial encounter: Secondary | ICD-10-CM | POA: Insufficient documentation

## 2013-12-09 DIAGNOSIS — E039 Hypothyroidism, unspecified: Secondary | ICD-10-CM | POA: Insufficient documentation

## 2013-12-09 DIAGNOSIS — Y9210 Unspecified residential institution as the place of occurrence of the external cause: Secondary | ICD-10-CM | POA: Insufficient documentation

## 2013-12-09 DIAGNOSIS — Y9301 Activity, walking, marching and hiking: Secondary | ICD-10-CM | POA: Insufficient documentation

## 2013-12-09 DIAGNOSIS — R296 Repeated falls: Secondary | ICD-10-CM | POA: Insufficient documentation

## 2013-12-09 DIAGNOSIS — G40909 Epilepsy, unspecified, not intractable, without status epilepticus: Secondary | ICD-10-CM | POA: Insufficient documentation

## 2013-12-09 DIAGNOSIS — M129 Arthropathy, unspecified: Secondary | ICD-10-CM | POA: Insufficient documentation

## 2013-12-09 DIAGNOSIS — Z79899 Other long term (current) drug therapy: Secondary | ICD-10-CM | POA: Insufficient documentation

## 2013-12-09 DIAGNOSIS — S1093XA Contusion of unspecified part of neck, initial encounter: Principal | ICD-10-CM

## 2013-12-09 DIAGNOSIS — K219 Gastro-esophageal reflux disease without esophagitis: Secondary | ICD-10-CM | POA: Insufficient documentation

## 2013-12-09 DIAGNOSIS — S0083XA Contusion of other part of head, initial encounter: Principal | ICD-10-CM | POA: Insufficient documentation

## 2013-12-09 DIAGNOSIS — F0390 Unspecified dementia without behavioral disturbance: Secondary | ICD-10-CM | POA: Insufficient documentation

## 2013-12-09 LAB — CBC WITH DIFFERENTIAL/PLATELET
BASOS PCT: 0 % (ref 0–1)
Basophils Absolute: 0 10*3/uL (ref 0.0–0.1)
Eosinophils Absolute: 0.1 10*3/uL (ref 0.0–0.7)
Eosinophils Relative: 1 % (ref 0–5)
HEMATOCRIT: 35 % — AB (ref 36.0–46.0)
Hemoglobin: 11 g/dL — ABNORMAL LOW (ref 12.0–15.0)
LYMPHS PCT: 31 % (ref 12–46)
Lymphs Abs: 1.8 10*3/uL (ref 0.7–4.0)
MCH: 29.9 pg (ref 26.0–34.0)
MCHC: 31.4 g/dL (ref 30.0–36.0)
MCV: 95.1 fL (ref 78.0–100.0)
Monocytes Absolute: 0.4 10*3/uL (ref 0.1–1.0)
Monocytes Relative: 7 % (ref 3–12)
NEUTROS ABS: 3.6 10*3/uL (ref 1.7–7.7)
NEUTROS PCT: 61 % (ref 43–77)
PLATELETS: 177 10*3/uL (ref 150–400)
RBC: 3.68 MIL/uL — ABNORMAL LOW (ref 3.87–5.11)
RDW: 14.1 % (ref 11.5–15.5)
WBC: 5.9 10*3/uL (ref 4.0–10.5)

## 2013-12-09 LAB — URINALYSIS, ROUTINE W REFLEX MICROSCOPIC
Bilirubin Urine: NEGATIVE
Glucose, UA: NEGATIVE mg/dL
HGB URINE DIPSTICK: NEGATIVE
Ketones, ur: NEGATIVE mg/dL
Leukocytes, UA: NEGATIVE
Nitrite: NEGATIVE
PROTEIN: NEGATIVE mg/dL
Specific Gravity, Urine: 1.011 (ref 1.005–1.030)
UROBILINOGEN UA: 1 mg/dL (ref 0.0–1.0)
pH: 6.5 (ref 5.0–8.0)

## 2013-12-09 LAB — COMPREHENSIVE METABOLIC PANEL WITH GFR
ALT: 11 U/L (ref 0–35)
AST: 17 U/L (ref 0–37)
Albumin: 3.5 g/dL (ref 3.5–5.2)
Alkaline Phosphatase: 48 U/L (ref 39–117)
BUN: 22 mg/dL (ref 6–23)
CO2: 29 meq/L (ref 19–32)
Calcium: 9.1 mg/dL (ref 8.4–10.5)
Chloride: 103 meq/L (ref 96–112)
Creatinine, Ser: 1.06 mg/dL (ref 0.50–1.10)
GFR calc Af Amer: 50 mL/min — ABNORMAL LOW
GFR calc non Af Amer: 43 mL/min — ABNORMAL LOW
Glucose, Bld: 154 mg/dL — ABNORMAL HIGH (ref 70–99)
Potassium: 4.2 meq/L (ref 3.7–5.3)
Sodium: 144 meq/L (ref 137–147)
Total Bilirubin: 0.4 mg/dL (ref 0.3–1.2)
Total Protein: 6.5 g/dL (ref 6.0–8.3)

## 2013-12-09 LAB — I-STAT TROPONIN, ED: TROPONIN I, POC: 0 ng/mL (ref 0.00–0.08)

## 2013-12-09 LAB — VALPROIC ACID LEVEL: Valproic Acid Lvl: 27.2 ug/mL — ABNORMAL LOW (ref 50.0–100.0)

## 2013-12-09 NOTE — ED Provider Notes (Signed)
CSN: [id number]     Arrival date & time 12/09/13  1357 History   First MD Initiated Contact with Patient 12/09/13 1358     Chief Complaint  Patient presents with  . Fall     (Consider location/radiation/quality/duration/timing/severity/associated sxs/prior Treatment) The history is provided by the patient, the EMS personnel and medical records.   LEVEL 5 CAVEAT:  DEMENTIA This is a 80 year old female with past medical history significant for dementia, hypothyroidism, hypertension, frequent falls, presenting to the ED for an unwitnessed fall at her nursing facility. Patient states she does not recall exactly how or why she fell, states she just ended up in the floor. Patient was found on floor by nursing staff, but was awake and alert. She does have history of seizure disorder, however there was no post ictal state. Pt was noted to have a small hematoma to left side of her scalp.  On arrival to ED, pt remains awake and alert, conversing normally, and is baseline oriented.  She only complains of a mild headache, localized to area of impact on the left side of her head.  Pt is not currently on any anti-coagulants.  VS stable on arrival.  Past Medical History  Diagnosis Date  . Dementia   . Hypothyroid   . Arthritis   . Hypertension   . GERD (gastroesophageal reflux disease)   . Diverticulitis    No past surgical history on file. No family history on file. History  Substance Use Topics  . Smoking status: Never Smoker   . Smokeless tobacco: Not on file  . Alcohol Use: No   OB History   Grav Para Term Preterm Abortions TAB SAB Ect Mult Living                 Review of Systems  Unable to perform ROS: Dementia      Allergies  Aricept; Codeine; Hydrocodone; Meloxicam; and Sulfa antibiotics  Home Medications   Prior to Admission medications   Medication Sig Start Date End Date Taking? Authorizing Provider  acetaminophen (TYLENOL) 500 MG tablet Take 1,000 mg by mouth daily.      Historical Provider, MD  acetaminophen (TYLENOL) 500 MG tablet Take 1,000 mg by mouth every 4 (four) hours as needed for mild pain or moderate pain. NTE 3000mg  in 24 hours    Historical Provider, MD  Cholecalciferol (VITAMIN D3) 2000 UNITS capsule Take 2,000 Units by mouth daily.    Historical Provider, MD  divalproex (DEPAKOTE SPRINKLE) 125 MG capsule Take 125 mg by mouth at bedtime.    Historical Provider, MD  ibuprofen (ADVIL,MOTRIN) 400 MG tablet Take 400 mg by mouth every 8 (eight) hours as needed for pain.    Historical Provider, MD  levothyroxine (SYNTHROID, LEVOTHROID) 50 MCG tablet Take 50 mcg by mouth daily before breakfast.     Historical Provider, MD  LORazepam (ATIVAN) 0.5 MG tablet Take 0.5 mg by mouth every morning. Take one tablet in the morning and 1 tablet at 8pm.    Historical Provider, MD  LORazepam (ATIVAN) 0.5 MG tablet Take 0.5 mg by mouth every evening.    Historical Provider, MD  magnesium hydroxide (MILK OF MAGNESIA) 400 MG/5ML suspension Take 30 mLs by mouth daily as needed for mild constipation.    Historical Provider, MD  Melatonin 5 MG CAPS Take 1 capsule by mouth daily.    Historical Provider, MD  omega-3 acid ethyl esters (LOVAZA) 1 G capsule Take 1 g by mouth every morning.  Historical Provider, MD  Omega-3 Fatty Acids (FISH OIL PO) Take 1 capsule by mouth daily.    Historical Provider, MD  omeprazole (PRILOSEC) 20 MG capsule Take 40 mg by mouth daily before breakfast.     Historical Provider, MD  senna (SENOKOT) 8.6 MG tablet Take 1 tablet by mouth every morning.     Historical Provider, MD   BP 153/66  Pulse 65  Temp(Src) 97.6 F (36.4 C) (Oral)  Resp 14  Physical Exam  Nursing note and vitals reviewed. Constitutional: She appears well-developed and well-nourished. No distress.  HENT:  Head: Normocephalic. Head is with contusion.  Mouth/Throat: Oropharynx is clear and moist.  Left parietal region with small hematoma present; no abrasion or open  laceration  Eyes: Conjunctivae and EOM are normal. Pupils are equal, round, and reactive to light.  Neck: Normal range of motion. Neck supple.  Cardiovascular: Normal rate, regular rhythm and normal heart sounds.   Pulmonary/Chest: Effort normal and breath sounds normal. No respiratory distress. She has no wheezes.  Abdominal: Soft. Bowel sounds are normal. There is no tenderness. There is no guarding.  Musculoskeletal: Normal range of motion.       Cervical back: Normal.  Pelvis stable, no tenderness of either hip, no leg shortening, pulses intact BLE  Neurological: She is alert.  AAO to self and place, confused regarding date; answering questions and following appropriately; equal strength UE and LE bilaterally; CN grossly intact; moves all extremities appropriately without ataxia; no focal neuro deficits or facial asymmetry appreciated; no tremors or seizure activity  Skin: Skin is warm and dry. She is not diaphoretic.  Psychiatric: She has a normal mood and affect.    ED Course  Procedures (including critical care time) Labs Review Labs Reviewed  CBC WITH DIFFERENTIAL - Abnormal; Notable for the following:    RBC 3.68 (*)    Hemoglobin 11.0 (*)    HCT 35.0 (*)    All other components within normal limits  COMPREHENSIVE METABOLIC PANEL - Abnormal; Notable for the following:    Glucose, Bld 154 (*)    GFR calc non Af Amer 43 (*)    GFR calc Af Amer 50 (*)    All other components within normal limits  VALPROIC ACID LEVEL - Abnormal; Notable for the following:    Valproic Acid Lvl 27.2 (*)    All other components within normal limits  URINALYSIS, ROUTINE W REFLEX MICROSCOPIC  I-STAT TROPOININ, ED    Imaging Review Dg Chest 2 View  12/09/2013   CLINICAL DATA:  Fall.  Dementia.  EXAM: CHEST  2 VIEW  COMPARISON:  None.  FINDINGS: The heart size and mediastinal contours are within normal limits. Both lungs are clear. The bones appear diffusely osteopenic.  IMPRESSION: No active  cardiopulmonary disease.   Electronically Signed   By: Signa Kellaylor  Stroud M.D.   On: 12/09/2013 15:22   Dg Pelvis 1-2 Views  12/09/2013   CLINICAL DATA:  Fall.  Dementia.  EXAM: PELVIS - 1-2 VIEW  COMPARISON:  None.  FINDINGS: Diffuse osteopenia. Symmetric degenerative changes in the hips bilaterally. Degenerative changes in the visualized lower lumbar spine. SI joints are symmetric and unremarkable. No acute bony abnormality. Specifically, no fracture, subluxation, or dislocation. Soft tissues are intact.  IMPRESSION: No acute bony abnormality.   Electronically Signed   By: Charlett NoseKevin  Dover M.D.   On: 12/09/2013 15:22   Ct Head Wo Contrast  12/09/2013   CLINICAL DATA:  Severe headache.  Unwitnessed fall.  Dementia.  EXAM: CT HEAD WITHOUT CONTRAST  CT CERVICAL SPINE WITHOUT CONTRAST  TECHNIQUE: Multidetector CT imaging of the head and cervical spine was performed following the standard protocol without intravenous contrast. Multiplanar CT image reconstructions of the cervical spine were also generated.  COMPARISON:  CT head and cervical spine 07/04/2013.  FINDINGS: CT HEAD FINDINGS  Atrophy and white matter disease is stable. No acute infarct, hemorrhage, or mass lesion is present. Ventricles are proportionate to the degree of atrophy. No significant extra-axial fluid collection is present. A left parietal scalp hematoma is present without an underlying fracture. An additional left frontal scalp laceration is noted.  Mild mucosal thickening is present in the left maxillary sinus. The remaining paranasal sinuses are clear. The mastoid air cells are clear.  CT CERVICAL SPINE FINDINGS  The cervical spine is imaged from the skullbase through T2-3. No acute fracture or traumatic subluxation is present. Multilevel spondylosis is stable. The posterior elements are fused bilaterally at C7-T1. Chronic endplate marrow at scratch the chronic endplate changes are noted from C3-4 through C6-7.  IMPRESSION: 1. Left parietal scalp  hematoma.  No underlying fracture. 2. No acute intracranial abnormality. Stable atrophy and white matter disease. 3. Stable spondylosis of the cervical spine. No acute fracture or traumatic subluxation.   Electronically Signed   By: Gennette Pac M.D.   On: 12/09/2013 16:11   Ct Cervical Spine Wo Contrast  12/09/2013   CLINICAL DATA:  Severe headache.  Unwitnessed fall.  Dementia.  EXAM: CT HEAD WITHOUT CONTRAST  CT CERVICAL SPINE WITHOUT CONTRAST  TECHNIQUE: Multidetector CT imaging of the head and cervical spine was performed following the standard protocol without intravenous contrast. Multiplanar CT image reconstructions of the cervical spine were also generated.  COMPARISON:  CT head and cervical spine 07/04/2013.  FINDINGS: CT HEAD FINDINGS  Atrophy and white matter disease is stable. No acute infarct, hemorrhage, or mass lesion is present. Ventricles are proportionate to the degree of atrophy. No significant extra-axial fluid collection is present. A left parietal scalp hematoma is present without an underlying fracture. An additional left frontal scalp laceration is noted.  Mild mucosal thickening is present in the left maxillary sinus. The remaining paranasal sinuses are clear. The mastoid air cells are clear.  CT CERVICAL SPINE FINDINGS  The cervical spine is imaged from the skullbase through T2-3. No acute fracture or traumatic subluxation is present. Multilevel spondylosis is stable. The posterior elements are fused bilaterally at C7-T1. Chronic endplate marrow at scratch the chronic endplate changes are noted from C3-4 through C6-7.  IMPRESSION: 1. Left parietal scalp hematoma.  No underlying fracture. 2. No acute intracranial abnormality. Stable atrophy and white matter disease. 3. Stable spondylosis of the cervical spine. No acute fracture or traumatic subluxation.   Electronically Signed   By: Gennette Pac M.D.   On: 12/09/2013 16:11     EKG Interpretation   Date/Time:  Saturday December 09 2013 14:19:15 EDT Ventricular Rate:  65 PR Interval:  167 QRS Duration: 88 QT Interval:  439 QTC Calculation: 456 R Axis:   -44 Text Interpretation:  Sinus rhythm Left anterior fascicular block Abnormal  R-wave progression, early transition No significant change since Jan 2015  Confirmed by Criss Alvine  MD, SCOTT ([id number]) on 12/09/2013 3:33:36 PM      MDM   Final diagnoses:  Fall  Scalp hematoma   80 y.o. female with hx of dementia presenting with un-witnessed fall at SNF.  Pt does have hx of seizure disorder,  however was never post-ictal at any point today.  On my evaluation, pt is awake, alert, and answering questions appropriately.  She does have hematoma to her left scalp.  No open laceration, abrasion, or active bleeding.  Will obtain CT head and CS.  EKG, labs, depakote level, u/a pending.  EKG sinus rhythm without ischemic change. Troponin is negative. Labs are reassuring. CT head with scalp hematoma, all other imaging negative for acute findings. Patient has been out of bed multiple times and ambulated down the hallways without difficulty.  She remains baseline oriented without focal deficits.  Patient's Depakote level is slightly low, however without postictal period, do not feel her fall today was from a seizure. It was noted that her Depakote dosing has recently been changed, she will follow with her primary care physician regarding this.  Discussed plan with patient, he/she acknowledged understanding and agreed with plan of care.  Return precautions given for new or worsening symptoms.  Pt discharged back to SNF in stable condition.  Case discussed with Dr. Criss AlvineGoldston who agreed with plan of care.  Garlon HatchetLisa M Sanders, PA-C 12/09/13 1640

## 2013-12-09 NOTE — ED Notes (Signed)
Per EMS, [facility]. Pt was walking this morning without her walker or cane and fell, unwitnessed. Pt has hx of dementia and is unable to be the historian for the fall. Pt was conscious when found. Pt is A&Ox3. Pt has hematoma on the left side of her head. C/o pain at the site but denies pain elsewhere.

## 2013-12-09 NOTE — Discharge Instructions (Signed)
Continue home pain medications. Follow-up with your primary care physician regarding your depakote dosing. Return to the ED for new concerns.

## 2013-12-10 NOTE — ED Provider Notes (Signed)
Medical screening examination/treatment/procedure(s) were conducted as a shared visit with non-physician practitioner(s) and myself.  I personally evaluated the patient during the encounter.   EKG Interpretation   Date/Time:  Saturday December 09 2013 14:19:15 EDT Ventricular Rate:  65 PR Interval:  167 QRS Duration: 88 QT Interval:  439 QTC Calculation: 456 R Axis:   -44 Text Interpretation:  Sinus rhythm Left anterior fascicular block Abnormal  R-wave progression, early transition No significant change since Jan 2015  Confirmed by Criss AlvineGOLDSTON  MD, Ruthia Person [phone]([id number]) on 12/09/2013 3:33:36 PM       Patient with fall, occipital hematoma. No significant injuries seen. Seen ambulating normally in hallway. Stable for discharge.  Audree CamelScott T Rafay Dahan, MD 12/10/13 [phone]

## 2014-02-12 ENCOUNTER — Emergency Department (HOSPITAL_COMMUNITY): Payer: PRIVATE HEALTH INSURANCE

## 2014-02-12 ENCOUNTER — Emergency Department (HOSPITAL_COMMUNITY)
Admission: EM | Admit: 2014-02-12 | Discharge: 2014-02-12 | Disposition: A | Discharge status: Home / Self Care | Payer: PRIVATE HEALTH INSURANCE | Attending: Emergency Medicine | Admitting: Emergency Medicine

## 2014-02-12 ENCOUNTER — Encounter (HOSPITAL_COMMUNITY): Payer: Self-pay | Admitting: Emergency Medicine

## 2014-02-12 DIAGNOSIS — Y9389 Activity, other specified: Secondary | ICD-10-CM | POA: Insufficient documentation

## 2014-02-12 DIAGNOSIS — K219 Gastro-esophageal reflux disease without esophagitis: Secondary | ICD-10-CM | POA: Insufficient documentation

## 2014-02-12 DIAGNOSIS — W010XXA Fall on same level from slipping, tripping and stumbling without subsequent striking against object, initial encounter: Secondary | ICD-10-CM | POA: Insufficient documentation

## 2014-02-12 DIAGNOSIS — Y929 Unspecified place or not applicable: Secondary | ICD-10-CM | POA: Insufficient documentation

## 2014-02-12 DIAGNOSIS — S42213A Unspecified displaced fracture of surgical neck of unspecified humerus, initial encounter for closed fracture: Secondary | ICD-10-CM | POA: Diagnosis not present

## 2014-02-12 DIAGNOSIS — F0390 Unspecified dementia without behavioral disturbance: Secondary | ICD-10-CM | POA: Insufficient documentation

## 2014-02-12 DIAGNOSIS — W19XXXA Unspecified fall, initial encounter: Secondary | ICD-10-CM

## 2014-02-12 DIAGNOSIS — S46909A Unspecified injury of unspecified muscle, fascia and tendon at shoulder and upper arm level, unspecified arm, initial encounter: Secondary | ICD-10-CM | POA: Diagnosis present

## 2014-02-12 DIAGNOSIS — S4980XA Other specified injuries of shoulder and upper arm, unspecified arm, initial encounter: Secondary | ICD-10-CM | POA: Diagnosis present

## 2014-02-12 DIAGNOSIS — Z79899 Other long term (current) drug therapy: Secondary | ICD-10-CM | POA: Diagnosis not present

## 2014-02-12 DIAGNOSIS — S42211A Unspecified displaced fracture of surgical neck of right humerus, initial encounter for closed fracture: Secondary | ICD-10-CM

## 2014-02-12 DIAGNOSIS — I10 Essential (primary) hypertension: Secondary | ICD-10-CM | POA: Insufficient documentation

## 2014-02-12 DIAGNOSIS — E039 Hypothyroidism, unspecified: Secondary | ICD-10-CM | POA: Diagnosis not present

## 2014-02-12 MED ORDER — TRAMADOL HCL 50 MG PO TABS: 50.0000 mg | ORAL_TABLET | Freq: Four times a day (QID) | ORAL | Status: DC | PRN

## 2014-02-12 NOTE — ED Provider Notes (Signed)
CSN: [id number]     Arrival date & time 02/12/14  0857 History   First MD Initiated Contact with Patient 02/12/14 [phone]     Chief Complaint  Patient presents with  . Fall  . Arm Pain    right     (Consider location/radiation/quality/duration/timing/severity/associated sxs/prior Treatment) HPI Comments: Patient is a 80 year old female with a past medical history of dementia, hypothyroidism, diverticulitis, hypertension, and GERD who presents from [facility] via EMS after a witnessed mechanical fall that occurred prior to arrival. Patient tripped while walking and landed on her right arm. She did not hit her head or lose consciousness per staff. She is alert to her baseline prior to and after the fall. She complains of right arm pain and is unwilling to move the arm. She is unable to characterize the pain. No other associated symptoms. No other injuries.   Patient is a 80 y.o. female presenting with fall and arm pain.  Fall Associated symptoms include arthralgias and joint swelling. Pertinent negatives include no abdominal pain, chest pain, chills, fatigue, fever, nausea, neck pain, vomiting or weakness.  Arm Pain Associated symptoms include arthralgias and joint swelling. Pertinent negatives include no abdominal pain, chest pain, chills, fatigue, fever, nausea, neck pain, vomiting or weakness.    Past Medical History  Diagnosis Date  . Dementia   . Hypothyroid   . Arthritis   . Hypertension   . GERD (gastroesophageal reflux disease)   . Diverticulitis    History reviewed. No pertinent past surgical history. No family history on file. History  Substance Use Topics  . Smoking status: Never Smoker   . Smokeless tobacco: Not on file  . Alcohol Use: No   OB History   Grav Para Term Preterm Abortions TAB SAB Ect Mult Living                 Review of Systems  Constitutional: Negative for fever, chills and fatigue.  HENT: Negative for trouble swallowing.   Eyes:  Negative for visual disturbance.  Respiratory: Negative for shortness of breath.   Cardiovascular: Negative for chest pain and palpitations.  Gastrointestinal: Negative for nausea, vomiting, abdominal pain and diarrhea.  Genitourinary: Negative for dysuria and difficulty urinating.  Musculoskeletal: Positive for arthralgias and joint swelling. Negative for neck pain.  Skin: Negative for color change.  Neurological: Negative for dizziness and weakness.  Psychiatric/Behavioral: Negative for dysphoric mood.      Allergies  Aricept; Codeine; Hydrocodone; Meloxicam; and Sulfa antibiotics  Home Medications   Prior to Admission medications   Medication Sig Start Date End Date Taking? Authorizing Provider  acetaminophen (TYLENOL) 500 MG tablet Take 1,000 mg by mouth daily.     Historical Provider, MD  acetaminophen (TYLENOL) 500 MG tablet Take 1,000 mg by mouth every 4 (four) hours as needed for mild pain or moderate pain. NTE 3000mg  in 24 hours    Historical Provider, MD  Cholecalciferol (VITAMIN D3) 2000 UNITS capsule Take 2,000 Units by mouth daily.    Historical Provider, MD  divalproex (DEPAKOTE SPRINKLE) 125 MG capsule Take 125 mg by mouth 2 (two) times daily.     Historical Provider, MD  ibuprofen (ADVIL,MOTRIN) 400 MG tablet Take 400 mg by mouth every 8 (eight) hours as needed for pain.    Historical Provider, MD  levothyroxine (SYNTHROID, LEVOTHROID) 50 MCG tablet Take 50 mcg by mouth daily before breakfast.     Historical Provider, MD  LORazepam (ATIVAN) 0.5 MG tablet Take 0.25 mg  by mouth 3 (three) times daily. Take one tablet in the morning and 1 tablet at 8pm.    Historical Provider, MD  LORazepam (ATIVAN) 0.5 MG tablet Take 0.5 mg by mouth every 6 (six) hours as needed (agitation).    Historical Provider, MD  Melatonin 5 MG CAPS Take 1 capsule by mouth daily.    Historical Provider, MD  Omega-3 Fatty Acids (FISH OIL PO) Take 1 capsule by mouth daily.    Historical Provider, MD   omeprazole (PRILOSEC) 20 MG capsule Take 40 mg by mouth daily before breakfast.     Historical Provider, MD  senna (SENOKOT) 8.6 MG tablet Take 1 tablet by mouth every morning.     Historical Provider, MD  senna (SENOKOT) 8.6 MG TABS tablet Take 2 tablets by mouth daily as needed for mild constipation.    Historical Provider, MD  sodium phosphate (FLEET) enema Place 1 enema rectally daily as needed (for constipation). follow package directions    Historical Provider, MD  spironolactone (ALDACTONE) 25 MG tablet Take 12.5 mg by mouth daily.    Historical Provider, MD   BP 200/85  Pulse 55  Temp(Src) 97.7 F (36.5 C) (Oral)  Resp 14  SpO2 94% Physical Exam  Nursing note and vitals reviewed. Constitutional: She appears well-developed and well-nourished. No distress.  HENT:  Head: Normocephalic and atraumatic.  Eyes: Conjunctivae and EOM are normal.  Neck: Normal range of motion.  Cardiovascular: Normal rate and regular rhythm.  Exam reveals no gallop and no friction rub.   No murmur heard. Pulmonary/Chest: Effort normal and breath sounds normal. She has no wheezes. She has no rales. She exhibits no tenderness.  Abdominal: Soft. She exhibits no distension. There is no tenderness. There is no rebound and no guarding.  Musculoskeletal: Normal range of motion.  No midline spine tenderness to palpation. Right shoulder severely limited ROM due to pain. There is associated swelling and tenderness to palpation around the humeral neck area. No obvious deformity. Slightly limited ROM of right elbow due to pain without obvious deformity. Right anterior knee tenderness to palpation with mild edema and without deformity. Stable pelvis, no tenderness to palpation of bilateral hips.   Neurological: She is alert.  Patient not oriented due to dementia.    Skin: Skin is warm and dry.  No open wound.   Psychiatric: She has a normal mood and affect. Her behavior is normal.    ED Course  Procedures  (including critical care time) Labs Review Labs Reviewed - No data to display  SPLINT APPLICATION Date/Time: 12:02 PM Authorized by: Emilia Beck Consent: Verbal consent obtained. Risks and benefits: risks, benefits and alternatives were discussed Consent given by: patient Splint applied by: orthopedic technician Location details: right arm Splint type: sling immobilizer Supplies used: sling immobilizer Post-procedure: The splinted body part was neurovascularly unchanged following the procedure. Patient tolerance: Patient tolerated the procedure well with no immediate complications.     Imaging Review Dg Shoulder Right  02/12/2014   CLINICAL DATA:  Fall with right shoulder pain.  EXAM: RIGHT SHOULDER - 2+ VIEW  COMPARISON:  None.  FINDINGS: There is a mildly impacted fracture of the right humeral neck. No dislocation. Acromioclavicular joint appears intact. Visualized portion of the right chest is unremarkable. Surgical clips in the right axilla.  IMPRESSION: Mildly impacted right humeral neck fracture.   Electronically Signed   By: Leanna Battles M.D.   On: 02/12/2014 10:11   Dg Elbow Complete Right  02/12/2014  CLINICAL DATA:  Fall with right shoulder pain.  EXAM: RIGHT ELBOW - COMPLETE 3+ VIEW  COMPARISON:  None.  FINDINGS: No acute osseous or joint abnormality.  IMPRESSION: No acute osseous or joint abnormality.   Electronically Signed   By: Leanna BattlesMelinda  Blietz M.D.   On: 02/12/2014 10:12   Dg Knee Complete 4 Views Right  02/12/2014   CLINICAL DATA:  Fall.  EXAM: RIGHT KNEE - COMPLETE 4+ VIEW  COMPARISON:  No prior.  FINDINGS: No acute bony or joint abnormality. Diffuse mild osteopenia is present. Mild tricompartment degenerative change present. Degenerative changes most prominent at the patellofemoral joint space. No evidence of the fusion.  IMPRESSION: 1. Osteopenia. 2. Degenerative change.  No acute abnormality.   Electronically Signed   By: Maisie Fushomas  Register   On: 02/12/2014  10:13     EKG Interpretation None      MDM   Final diagnoses:  Humeral surgical neck fracture, right, closed, initial encounter  Fall, initial encounter    9:14 AM Xray of right shoulder, elbow, and knee pending. Vitals stable and patient afebrile. No other injuries. Patient had a witnessed mechanical fall so head CT and cervical spine not needed at this time.   11:00 AM Patient has a mildly impacted right humeral neck fracture. I will consult Orthopedics.   12:01 PM Dr. Fayrene FearingXiu recommends sling for right arm and office follow up. Patient will be discharged.   Emilia BeckKaitlyn Lesslie Mossa, PA-C 02/12/14 1601

## 2014-02-12 NOTE — ED Provider Notes (Signed)
Medical screening examination/treatment/procedure(s) were conducted as a shared visit with non-physician practitioner(s) and myself.  I personally evaluated the patient during the encounter   .Face to face Exam:  General:  A&Ox3 HEENT:  Atraumatic Resp:  Normal effort Abd:  Nondistended Neuro:No focal deficits    Nelia Shiobert L Jaquavian Firkus, MD 02/12/14 2207

## 2014-02-12 NOTE — ED Notes (Signed)
Patient returned from X-ray 

## 2014-02-12 NOTE — ED Notes (Signed)
[facility] notified about [facility]

## 2014-02-12 NOTE — ED Notes (Signed)
[facility] For fall this morning after tripping, was a witness fall. Pt didn't hit her head. Pt c/o right arm pain. No visual deformities. Pt is alert to her baseline per facility staff.

## 2014-02-12 NOTE — Discharge Instructions (Signed)
Wear sling until follow up visit. Take Tramadol as needed for pain. Follow up with Dr. Roda ShuttersXu.

## 2014-02-12 NOTE — ED Notes (Signed)
Bed: WA15 Expected date:  Expected time:  Means of arrival:  Comments: EMS-fall 

## 2014-02-24 ENCOUNTER — Emergency Department (HOSPITAL_COMMUNITY): Payer: PRIVATE HEALTH INSURANCE

## 2014-02-24 ENCOUNTER — Encounter (HOSPITAL_COMMUNITY): Payer: Self-pay | Admitting: Emergency Medicine

## 2014-02-24 ENCOUNTER — Emergency Department (HOSPITAL_COMMUNITY)
Admission: EM | Admit: 2014-02-24 | Discharge: 2014-02-24 | Disposition: A | Discharge status: Skilled Nursing Facility | Payer: PRIVATE HEALTH INSURANCE | Attending: Emergency Medicine | Admitting: Emergency Medicine

## 2014-02-24 DIAGNOSIS — Z043 Encounter for examination and observation following other accident: Secondary | ICD-10-CM | POA: Insufficient documentation

## 2014-02-24 DIAGNOSIS — F0390 Unspecified dementia without behavioral disturbance: Secondary | ICD-10-CM | POA: Diagnosis not present

## 2014-02-24 DIAGNOSIS — E039 Hypothyroidism, unspecified: Secondary | ICD-10-CM | POA: Insufficient documentation

## 2014-02-24 DIAGNOSIS — S0990XA Unspecified injury of head, initial encounter: Secondary | ICD-10-CM | POA: Insufficient documentation

## 2014-02-24 DIAGNOSIS — Z79899 Other long term (current) drug therapy: Secondary | ICD-10-CM | POA: Insufficient documentation

## 2014-02-24 DIAGNOSIS — W1809XA Striking against other object with subsequent fall, initial encounter: Secondary | ICD-10-CM | POA: Insufficient documentation

## 2014-02-24 DIAGNOSIS — M129 Arthropathy, unspecified: Secondary | ICD-10-CM | POA: Insufficient documentation

## 2014-02-24 DIAGNOSIS — K219 Gastro-esophageal reflux disease without esophagitis: Secondary | ICD-10-CM | POA: Insufficient documentation

## 2014-02-24 DIAGNOSIS — I10 Essential (primary) hypertension: Secondary | ICD-10-CM | POA: Diagnosis not present

## 2014-02-24 DIAGNOSIS — Y9289 Other specified places as the place of occurrence of the external cause: Secondary | ICD-10-CM | POA: Diagnosis not present

## 2014-02-24 DIAGNOSIS — Y9301 Activity, walking, marching and hiking: Secondary | ICD-10-CM | POA: Insufficient documentation

## 2014-02-24 DIAGNOSIS — W19XXXA Unspecified fall, initial encounter: Secondary | ICD-10-CM

## 2014-02-24 NOTE — ED Notes (Signed)
Patient returned from CT

## 2014-02-24 NOTE — ED Notes (Signed)
Patient leaving with PTAR at this moment

## 2014-02-24 NOTE — Discharge Instructions (Signed)

## 2014-02-24 NOTE — ED Notes (Addendum)
From [facility] Memory care unit; had a fall 2 weeks ago and arm in sling, so can't use her walker. Walking and stumbled due to not using walker and fell, hit left side of head on carpet. Small contusion, minimal tenderness, no laceration. Per [facility] reports no LOC and remembers falling. Denies pain or symptoms.

## 2014-02-24 NOTE — ED Provider Notes (Signed)
CSN: [id number]     Arrival date & time 02/24/14  1349 History   First MD Initiated Contact with Patient 02/24/14 1352     Chief Complaint  Patient presents with  . Fall     (Consider location/radiation/quality/duration/timing/severity/associated sxs/prior Treatment) Patient is a 80 y.o. female presenting with fall.  Fall This is a new problem. The current episode started 6 to 12 hours ago. The problem occurs constantly. The problem has not changed since onset.Pertinent negatives include no chest pain, no headaches and no shortness of breath. Nothing aggravates the symptoms. Nothing relieves the symptoms. She has tried nothing for the symptoms.    Past Medical History  Diagnosis Date  . Dementia   . Hypothyroid   . Arthritis   . Hypertension   . GERD (gastroesophageal reflux disease)   . Diverticulitis    History reviewed. No pertinent past surgical history. History reviewed. No pertinent family history. History  Substance Use Topics  . Smoking status: Never Smoker   . Smokeless tobacco: Not on file  . Alcohol Use: No   OB History   Grav Para Term Preterm Abortions TAB SAB Ect Mult Living                 Review of Systems  Unable to perform ROS: Dementia  Respiratory: Negative for shortness of breath.   Cardiovascular: Negative for chest pain.  Neurological: Negative for headaches.      Allergies  Aricept; Codeine; Hydrocodone; Meloxicam; and Sulfa antibiotics  Home Medications   Prior to Admission medications   Medication Sig Start Date End Date Taking? Authorizing Provider  acetaminophen (TYLENOL) 500 MG tablet Take 500 mg by mouth every 6 (six) hours. Take 1 tablet every 6 hours for 1 month (started 8/19)   Yes Historical Provider, MD  Cholecalciferol (VITAMIN D3) 2000 UNITS capsule Take 2,000 Units by mouth daily.   Yes Historical Provider, MD  divalproex (DEPAKOTE SPRINKLE) 125 MG capsule Take 125 mg by mouth 2 (two) times daily.    Yes Historical Provider,  MD  levothyroxine (SYNTHROID, LEVOTHROID) 50 MCG tablet Take 50 mcg by mouth daily before breakfast.    Yes Historical Provider, MD  LORazepam (ATIVAN) 0.5 MG tablet Take 0.5 mg by mouth See admin instructions. Take three times a day plus an additional tablet every 6 hours as need for agitation   Yes Historical Provider, MD  Melatonin 5 MG CAPS Take 1 capsule by mouth at bedtime.    Yes Historical Provider, MD  omega-3 acid ethyl esters (LOVAZA) 1 G capsule Take 1 g by mouth daily.   Yes Historical Provider, MD  omeprazole (PRILOSEC) 20 MG capsule Take 40 mg by mouth daily before breakfast.    Yes Historical Provider, MD  oxyCODONE (OXY IR/ROXICODONE) 5 MG immediate release tablet Take 2.5 mg by mouth every 6 (six) hours as needed for moderate pain or severe pain.   Yes Historical Provider, MD   BP 134/81  Pulse 60  Temp(Src) 97.9 F (36.6 C) (Oral)  Resp 14  Ht  (1.575 m)  Wt 130 lb (58.968 kg)  BMI 23.77 kg/m2  SpO2 98% Physical Exam  Vitals reviewed. Constitutional: She appears well-developed and well-nourished.  HENT:  Head: Normocephalic and atraumatic.  Right Ear: External ear normal.  Left Ear: External ear normal.  Eyes: Conjunctivae and EOM are normal. Pupils are equal, round, and reactive to light.  Neck: Normal range of motion. Neck supple.  Cardiovascular: Normal rate, regular rhythm, normal heart  sounds and intact distal pulses.   Pulmonary/Chest: Effort normal and breath sounds normal.  Abdominal: Soft. Bowel sounds are normal. There is no tenderness.  Musculoskeletal: Normal range of motion.  Neurological: She is alert. She has normal strength and normal reflexes. No cranial nerve deficit or sensory deficit. GCS eye subscore is 4. GCS verbal subscore is 4. GCS motor subscore is 6.  Skin: Skin is warm and dry.    ED Course  Procedures (including critical care time) Labs Review Labs Reviewed - No data to display  Imaging Review Ct Head Wo  Contrast  02/24/2014   CLINICAL DATA:  80 year old female with head injury headache following fall  EXAM: CT HEAD WITHOUT CONTRAST  TECHNIQUE: Contiguous axial images were obtained from the base of the skull through the vertex without intravenous contrast.  COMPARISON:  12/09/2013 and prior head CTs dating back to 05/26/2012  FINDINGS: Moderate atrophy and chronic small-vessel white matter ischemic changes again noted.  No acute intracranial abnormalities are identified, including mass lesion or mass effect, hydrocephalus, extra-axial fluid collection, midline shift, hemorrhage, or acute infarction.  The visualized bony calvarium is unremarkable.  IMPRESSION: No evidence of acute intracranial abnormality. No evidence of fracture.  Atrophy and chronic small-vessel white matter ischemic changes.   Electronically Signed   By: Laveda Abbe M.D.   On: 02/24/2014 14:45     EKG Interpretation None      MDM   Final diagnoses:  Fall, initial encounter    80 y.o. female  with pertinent PMH of dementia, recent impacted humeral fx on R presents after mechanical fall from standing. Patient hit her head on the carpet however did not have loss of consciousness, denies any concussive symptoms. She was sent for further evaluation by the nursing facility.  On arrival vital signs and physical exam as above no acute trauma evident my exam.  Labs and imaging as above reviewed. CT head unremarkable.  DC to SNF in stable condition.  1. Fall, initial encounter         Mirian Mo, MD 02/25/14 [phone]

## 2014-02-24 NOTE — ED Notes (Signed)
Patient transported to CT 

## 2014-06-06 ENCOUNTER — Emergency Department (HOSPITAL_COMMUNITY): Payer: PRIVATE HEALTH INSURANCE

## 2014-06-06 ENCOUNTER — Emergency Department (HOSPITAL_COMMUNITY)
Admission: EM | Admit: 2014-06-06 | Discharge: 2014-06-06 | Disposition: A | Discharge status: Home / Self Care | Payer: PRIVATE HEALTH INSURANCE | Attending: Emergency Medicine | Admitting: Emergency Medicine

## 2014-06-06 ENCOUNTER — Encounter (HOSPITAL_COMMUNITY): Payer: Self-pay

## 2014-06-06 DIAGNOSIS — I10 Essential (primary) hypertension: Secondary | ICD-10-CM | POA: Insufficient documentation

## 2014-06-06 DIAGNOSIS — Y9301 Activity, walking, marching and hiking: Secondary | ICD-10-CM | POA: Insufficient documentation

## 2014-06-06 DIAGNOSIS — E039 Hypothyroidism, unspecified: Secondary | ICD-10-CM | POA: Insufficient documentation

## 2014-06-06 DIAGNOSIS — Y998 Other external cause status: Secondary | ICD-10-CM | POA: Insufficient documentation

## 2014-06-06 DIAGNOSIS — N390 Urinary tract infection, site not specified: Secondary | ICD-10-CM | POA: Insufficient documentation

## 2014-06-06 DIAGNOSIS — S0083XA Contusion of other part of head, initial encounter: Secondary | ICD-10-CM | POA: Insufficient documentation

## 2014-06-06 DIAGNOSIS — K219 Gastro-esophageal reflux disease without esophagitis: Secondary | ICD-10-CM | POA: Diagnosis not present

## 2014-06-06 DIAGNOSIS — M1990 Unspecified osteoarthritis, unspecified site: Secondary | ICD-10-CM | POA: Insufficient documentation

## 2014-06-06 DIAGNOSIS — F0390 Unspecified dementia without behavioral disturbance: Secondary | ICD-10-CM | POA: Insufficient documentation

## 2014-06-06 DIAGNOSIS — Z79899 Other long term (current) drug therapy: Secondary | ICD-10-CM | POA: Diagnosis not present

## 2014-06-06 DIAGNOSIS — W1839XA Other fall on same level, initial encounter: Secondary | ICD-10-CM | POA: Diagnosis not present

## 2014-06-06 DIAGNOSIS — Y9289 Other specified places as the place of occurrence of the external cause: Secondary | ICD-10-CM | POA: Insufficient documentation

## 2014-06-06 DIAGNOSIS — W19XXXA Unspecified fall, initial encounter: Secondary | ICD-10-CM

## 2014-06-06 DIAGNOSIS — S0990XA Unspecified injury of head, initial encounter: Secondary | ICD-10-CM | POA: Diagnosis present

## 2014-06-06 LAB — URINE MICROSCOPIC-ADD ON

## 2014-06-06 LAB — URINALYSIS, ROUTINE W REFLEX MICROSCOPIC
BILIRUBIN URINE: NEGATIVE
Glucose, UA: NEGATIVE mg/dL
KETONES UR: NEGATIVE mg/dL
NITRITE: POSITIVE — AB
PROTEIN: 100 mg/dL — AB
Specific Gravity, Urine: 1.015 (ref 1.005–1.030)
UROBILINOGEN UA: 1 mg/dL (ref 0.0–1.0)
pH: 6.5 (ref 5.0–8.0)

## 2014-06-06 LAB — CBG MONITORING, ED: Glucose-Capillary: 112 mg/dL — ABNORMAL HIGH (ref 70–99)

## 2014-06-06 MED ORDER — ONDANSETRON 4 MG PO TBDP
8.0000 mg | ORAL_TABLET | Freq: Once | ORAL | Status: AC
  Administered 2014-06-06: 8 mg via ORAL
  Filled 2014-06-06: qty 2

## 2014-06-06 MED ORDER — CEPHALEXIN 500 MG PO CAPS: 500.0000 mg | ORAL_CAPSULE | Freq: Three times a day (TID) | ORAL | Status: AC

## 2014-06-06 MED ORDER — CEPHALEXIN 250 MG PO CAPS
500.0000 mg | ORAL_CAPSULE | Freq: Once | ORAL | Status: AC
  Administered 2014-06-06: 500 mg via ORAL
  Filled 2014-06-06: qty 2

## 2014-06-06 NOTE — ED Notes (Signed)
Per [facility]: pt. [facility]. Pt found on floor after unwitnessed fall after attendant left pt. Alone to get pt. Walker. Pt. Immobilized with c-collar and KED. BP 175/83.

## 2014-06-06 NOTE — ED Notes (Signed)
Patient transported to CT 

## 2014-06-06 NOTE — ED Provider Notes (Signed)
CSN: [id number]     Arrival date & time 06/06/14  1533 History   First MD Initiated Contact with Patient 06/06/14 1539     Chief Complaint  Patient presents with  . Fall     (Consider location/radiation/quality/duration/timing/severity/associated sxs/prior Treatment) Patient is a 80 y.o. female presenting with fall.  Fall This is a recurrent problem. The current episode started today. The problem occurs intermittently. Associated symptoms include headaches. Pertinent negatives include no abdominal pain, anorexia, chest pain, chills, diaphoresis, fatigue, fever or weakness. Nothing aggravates the symptoms. She has tried nothing for the symptoms.    Past Medical History  Diagnosis Date  . Dementia   . Hypothyroid   . Arthritis   . Hypertension   . GERD (gastroesophageal reflux disease)   . Diverticulitis    History reviewed. No pertinent past surgical history. No family history on file. History  Substance Use Topics  . Smoking status: Never Smoker   . Smokeless tobacco: Not on file  . Alcohol Use: No   OB History    No data available     Review of Systems  Constitutional: Negative for fever, chills, diaphoresis and fatigue.  Cardiovascular: Negative for chest pain.  Gastrointestinal: Negative for abdominal pain and anorexia.  Skin:       Bruising to back of head  Neurological: Positive for headaches. Negative for weakness.  All other systems reviewed and are negative.     Allergies  Aricept; Codeine; Hydrocodone; Meloxicam; and Sulfa antibiotics  Home Medications   Prior to Admission medications   Medication Sig Start Date End Date Taking? Authorizing Provider  acetaminophen (TYLENOL) 500 MG tablet Take 500 mg by mouth every 6 (six) hours. Take 1 tablet every 6 hours for 1 month (started 8/19)    Historical Provider, MD  Cholecalciferol (VITAMIN D3) 2000 UNITS capsule Take 2,000 Units by mouth daily.    Historical Provider, MD  divalproex (DEPAKOTE SPRINKLE) 125  MG capsule Take 125 mg by mouth 2 (two) times daily.     Historical Provider, MD  levothyroxine (SYNTHROID, LEVOTHROID) 50 MCG tablet Take 50 mcg by mouth daily before breakfast.     Historical Provider, MD  LORazepam (ATIVAN) 0.5 MG tablet Take 0.5 mg by mouth See admin instructions. Take three times a day plus an additional tablet every 6 hours as need for agitation    Historical Provider, MD  Melatonin 5 MG CAPS Take 1 capsule by mouth at bedtime.     Historical Provider, MD  omega-3 acid ethyl esters (LOVAZA) 1 G capsule Take 1 g by mouth daily.    Historical Provider, MD  omeprazole (PRILOSEC) 20 MG capsule Take 40 mg by mouth daily before breakfast.     Historical Provider, MD  oxyCODONE (OXY IR/ROXICODONE) 5 MG immediate release tablet Take 2.5 mg by mouth every 6 (six) hours as needed for moderate pain or severe pain.    Historical Provider, MD   There were no vitals taken for this visit. Physical Exam  Constitutional: She is oriented to person, place, and time. She appears well-developed and well-nourished.  HENT:  Head: Normocephalic.  Hematoma to the back of her head  Eyes: Conjunctivae and EOM are normal. Right eye exhibits no discharge. Left eye exhibits no discharge.  Cardiovascular: Normal rate and regular rhythm.   Pulmonary/Chest: Effort normal and breath sounds normal. No respiratory distress.  Abdominal: Soft. She exhibits no distension. There is no tenderness. There is no rebound.  Musculoskeletal: Normal range of motion. She  exhibits no edema or tenderness.  Neurological: She is alert and oriented to person, place, and time.  No altered mental status, able to give full seemingly accurate history.  Face is symmetric, EOM's intact, pupils equal and reactive, vision intact, tongue and uvula midline without deviation Upper and Lower extremity motor 5/5, intact pain perception in distal extremities, 2+ reflexes in biceps, patella and achilles tendons. Finger to nose slow but  normal.   Skin: Skin is warm and dry.  Nursing note and vitals reviewed.   ED Course  Procedures (including critical care time) Labs Review Labs Reviewed  URINALYSIS, ROUTINE W REFLEX MICROSCOPIC - Abnormal; Notable for the following:    APPearance TURBID (*)    Hgb urine dipstick MODERATE (*)    Protein, ur 100 (*)    Nitrite POSITIVE (*)    Leukocytes, UA LARGE (*)    All other components within normal limits  URINE MICROSCOPIC-ADD ON - Abnormal; Notable for the following:    Bacteria, UA MANY (*)    All other components within normal limits  CBG MONITORING, ED - Abnormal; Notable for the following:    Glucose-Capillary 112 (*)    All other components within normal limits  URINE CULTURE    Imaging Review Ct Head Wo Contrast  06/06/2014   CLINICAL DATA:  Initial encounter for found on floor earlier today after on witnessed fall.  EXAM: CT HEAD WITHOUT CONTRAST  CT CERVICAL SPINE WITHOUT CONTRAST  TECHNIQUE: Multidetector CT imaging of the head and cervical spine was performed following the standard protocol without intravenous contrast. Multiplanar CT image reconstructions of the cervical spine were also generated.  COMPARISON:  Head CT from 02/24/2014. Cervical spine CT from 12/09/2013.  FINDINGS: CT HEAD FINDINGS  There is no evidence for acute hemorrhage, hydrocephalus, mass lesion, or abnormal extra-axial fluid collection. No definite CT evidence for acute infarction. Diffuse loss of parenchymal volume is consistent with atrophy. Patchy low attenuation in the deep hemispheric and periventricular white matter is nonspecific, but likely reflects chronic microvascular ischemic demyelination.  Mild chronic mucosal disease is seen in the maxillary sinuses. No evidence for skull fracture.  CT CERVICAL SPINE FINDINGS  Imaging was obtained from the skullbase through the T1-2 interspace. No evidence for fracture. Degenerative endplate changes with loss of disc height seen diffusely in the  cervical spine from C3-4 down to C6-7. There is bilateral facet osteoarthritis with fusion of the left-sided C7-T1 facets. Normal cervical lordosis is preserved. No evidence for prevertebral soft tissue edema  IMPRESSION: Stable head CT. Atrophy with chronic small vessel white matter disease. No acute intracranial abnormality.  Stable CT scan of the cervical spine. Degenerative changes without acute fracture.   Electronically Signed   By: Kennith CenterEric  Mansell M.D.   On: 06/06/2014 18:43   Ct Cervical Spine Wo Contrast  06/06/2014   CLINICAL DATA:  Initial encounter for found on floor earlier today after on witnessed fall.  EXAM: CT HEAD WITHOUT CONTRAST  CT CERVICAL SPINE WITHOUT CONTRAST  TECHNIQUE: Multidetector CT imaging of the head and cervical spine was performed following the standard protocol without intravenous contrast. Multiplanar CT image reconstructions of the cervical spine were also generated.  COMPARISON:  Head CT from 02/24/2014. Cervical spine CT from 12/09/2013.  FINDINGS: CT HEAD FINDINGS  There is no evidence for acute hemorrhage, hydrocephalus, mass lesion, or abnormal extra-axial fluid collection. No definite CT evidence for acute infarction. Diffuse loss of parenchymal volume is consistent with atrophy. Patchy low attenuation in the  deep hemispheric and periventricular white matter is nonspecific, but likely reflects chronic microvascular ischemic demyelination.  Mild chronic mucosal disease is seen in the maxillary sinuses. No evidence for skull fracture.  CT CERVICAL SPINE FINDINGS  Imaging was obtained from the skullbase through the T1-2 interspace. No evidence for fracture. Degenerative endplate changes with loss of disc height seen diffusely in the cervical spine from C3-4 down to C6-7. There is bilateral facet osteoarthritis with fusion of the left-sided C7-T1 facets. Normal cervical lordosis is preserved. No evidence for prevertebral soft tissue edema  IMPRESSION: Stable head CT. Atrophy  with chronic small vessel white matter disease. No acute intracranial abnormality.  Stable CT scan of the cervical spine. Degenerative changes without acute fracture.   Electronically Signed   By: Kennith CenterEric  Mansell M.D.   On: 06/06/2014 18:43     EKG Interpretation None      MDM   Final diagnoses:  Fall, initial encounter  UTI (lower urinary tract infection)    80 year old female with difficulty walking had a witnessed fall today. She normally uses a walker and did not have her walker and she went to take a step without it fell backwards and hit her head. No loss of consciousness. Initially EMS stated that it was an unwitnessed fall, however the nurse was there when this happened (who I spoke with) said that the patient initially did not complain of any pain except for pain in the back of her head where she hit it on the wall. Not have any other complaints with her EMS was called. EMSs arrival to put her in a c-collar and a K ED. Ear patient's only complaining of head pain. No exam is normal aside from being disoriented. Did not attempt a walker at this time. We'll get a head and neck CT to clear her. She is not on blood thinners this is negative she can likely go home. I doubt syncopal cause for her fall. I think this is a chronic problem and she fell because she did not have her walker.  Patient urinated in bedpan and it had an odor and was murky appearing so a UA was sent and found to have UTI. Afebirle, vs stable, can treat as an outpatient.  Marily MemosJason Anihya Tuma, MD 06/06/14 2351  Purvis SheffieldForrest Harrison, MD 06/07/14 1229

## 2014-06-08 LAB — URINE CULTURE

## 2014-09-07 ENCOUNTER — Emergency Department (HOSPITAL_COMMUNITY): Payer: Medicare Other

## 2014-09-07 ENCOUNTER — Emergency Department (HOSPITAL_COMMUNITY)
Admission: EM | Admit: 2014-09-07 | Discharge: 2014-09-08 | Disposition: A | Discharge status: Home / Self Care | Payer: Medicare Other | Attending: Emergency Medicine | Admitting: Emergency Medicine

## 2014-09-07 ENCOUNTER — Encounter (HOSPITAL_COMMUNITY): Payer: Self-pay | Admitting: *Deleted

## 2014-09-07 DIAGNOSIS — Y9289 Other specified places as the place of occurrence of the external cause: Secondary | ICD-10-CM | POA: Diagnosis not present

## 2014-09-07 DIAGNOSIS — K219 Gastro-esophageal reflux disease without esophagitis: Secondary | ICD-10-CM | POA: Insufficient documentation

## 2014-09-07 DIAGNOSIS — S199XXA Unspecified injury of neck, initial encounter: Secondary | ICD-10-CM | POA: Insufficient documentation

## 2014-09-07 DIAGNOSIS — E039 Hypothyroidism, unspecified: Secondary | ICD-10-CM | POA: Diagnosis not present

## 2014-09-07 DIAGNOSIS — S0990XA Unspecified injury of head, initial encounter: Secondary | ICD-10-CM | POA: Insufficient documentation

## 2014-09-07 DIAGNOSIS — Z8739 Personal history of other diseases of the musculoskeletal system and connective tissue: Secondary | ICD-10-CM | POA: Insufficient documentation

## 2014-09-07 DIAGNOSIS — Z79899 Other long term (current) drug therapy: Secondary | ICD-10-CM | POA: Insufficient documentation

## 2014-09-07 DIAGNOSIS — W19XXXA Unspecified fall, initial encounter: Secondary | ICD-10-CM | POA: Diagnosis not present

## 2014-09-07 DIAGNOSIS — Y9389 Activity, other specified: Secondary | ICD-10-CM | POA: Insufficient documentation

## 2014-09-07 DIAGNOSIS — F0390 Unspecified dementia without behavioral disturbance: Secondary | ICD-10-CM | POA: Insufficient documentation

## 2014-09-07 DIAGNOSIS — D72829 Elevated white blood cell count, unspecified: Secondary | ICD-10-CM | POA: Diagnosis not present

## 2014-09-07 DIAGNOSIS — S60812A Abrasion of left wrist, initial encounter: Secondary | ICD-10-CM | POA: Diagnosis not present

## 2014-09-07 DIAGNOSIS — Y998 Other external cause status: Secondary | ICD-10-CM | POA: Diagnosis not present

## 2014-09-07 DIAGNOSIS — I10 Essential (primary) hypertension: Secondary | ICD-10-CM | POA: Insufficient documentation

## 2014-09-07 DIAGNOSIS — S61512A Laceration without foreign body of left wrist, initial encounter: Secondary | ICD-10-CM

## 2014-09-07 LAB — CBC
HCT: 35.7 % — ABNORMAL LOW (ref 36.0–46.0)
Hemoglobin: 11.5 g/dL — ABNORMAL LOW (ref 12.0–15.0)
MCH: 30.1 pg (ref 26.0–34.0)
MCHC: 32.2 g/dL (ref 30.0–36.0)
MCV: 93.5 fL (ref 78.0–100.0)
PLATELETS: 171 10*3/uL (ref 150–400)
RBC: 3.82 MIL/uL — ABNORMAL LOW (ref 3.87–5.11)
RDW: 14.2 % (ref 11.5–15.5)
WBC: 12.6 10*3/uL — ABNORMAL HIGH (ref 4.0–10.5)

## 2014-09-07 LAB — BASIC METABOLIC PANEL
Anion gap: 5 (ref 5–15)
BUN: 26 mg/dL — ABNORMAL HIGH (ref 6–23)
CALCIUM: 8.4 mg/dL (ref 8.4–10.5)
CO2: 28 mmol/L (ref 19–32)
CREATININE: 1.18 mg/dL — AB (ref 0.50–1.10)
Chloride: 105 mmol/L (ref 96–112)
GFR calc Af Amer: 43 mL/min — ABNORMAL LOW (ref >90)
GFR calc non Af Amer: 37 mL/min — ABNORMAL LOW (ref >90)
Glucose, Bld: 107 mg/dL — ABNORMAL HIGH (ref 70–99)
Potassium: 4.4 mmol/L (ref 3.5–5.1)
Sodium: 138 mmol/L (ref 135–145)

## 2014-09-07 MED ORDER — CEPHALEXIN 500 MG PO CAPS: 500.0000 mg | ORAL_CAPSULE | Freq: Three times a day (TID) | ORAL | Status: DC

## 2014-09-07 MED ORDER — TETANUS-DIPHTH-ACELL PERTUSSIS 5-2.5-18.5 LF-MCG/0.5 IM SUSP
0.5000 mL | Freq: Once | INTRAMUSCULAR | Status: AC
  Administered 2014-09-07: 0.5 mL via INTRAMUSCULAR
  Filled 2014-09-07: qty 0.5

## 2014-09-07 MED ORDER — CEPHALEXIN 500 MG PO CAPS
500.0000 mg | ORAL_CAPSULE | Freq: Once | ORAL | Status: AC
  Administered 2014-09-07: 500 mg via ORAL
  Filled 2014-09-07: qty 1

## 2014-09-07 NOTE — ED Provider Notes (Signed)
CSN: [id number]     Arrival date & time 09/07/14  1508 History   First MD Initiated Contact with Patient 09/07/14 1546     Chief Complaint  Patient presents with  . Fall  . Back Pain  . Headache  . Neck Pain     (Consider location/radiation/quality/duration/timing/severity/associated sxs/prior Treatment) HPI  A LEVEL 5 CAVEAT PERTAINS DUE TO DEMENTIA.  Pt presenting after unwitnessed fall at her nursing facility.  Unknown whether she had LOC.  Pt does not remember the fall.  She c/o neck pain.    Past Medical History  Diagnosis Date  . Dementia   . Hypothyroid   . Arthritis   . Hypertension   . GERD (gastroesophageal reflux disease)   . Diverticulitis    History reviewed. No pertinent past surgical history. No family history on file. History  Substance Use Topics  . Smoking status: Never Smoker   . Smokeless tobacco: Not on file  . Alcohol Use: No   OB History    No data available     Review of Systems  UNABLE TO OBTAIN ROS DUE TO LEVEL 5 CAVEAT    Allergies  Aricept; Codeine; Hydrocodone; Meloxicam; and Sulfa antibiotics  Home Medications   Prior to Admission medications   Medication Sig Start Date End Date Taking? Authorizing Provider  acetaminophen (TYLENOL) 500 MG tablet Take 500 mg by mouth every 6 (six) hours. Take 1 tablet every 6 hours for 1 month (started 8/19)   Yes Historical Provider, MD  Cholecalciferol (VITAMIN D3) 2000 UNITS capsule Take 2,000 Units by mouth daily.   Yes Historical Provider, MD  divalproex (DEPAKOTE) 125 MG DR tablet Take 125 mg by mouth 2 (two) times daily.  09/05/14  Yes Historical Provider, MD  levothyroxine (SYNTHROID, LEVOTHROID) 50 MCG tablet Take 50 mcg by mouth daily before breakfast.    Yes Historical Provider, MD  LORazepam (ATIVAN) 0.5 MG tablet Take 0.5 mg by mouth See admin instructions. Take three times a day plus an additional tablet every 6 hours as need for agitation   Yes Historical Provider, MD  LORazepam (ATIVAN)  0.5 MG tablet Take 0.5 mg by mouth 3 (three) times daily.    Yes Historical Provider, MD  Melatonin 5 MG CAPS Take 1 capsule by mouth at bedtime.    Yes Historical Provider, MD  omega-3 acid ethyl esters (LOVAZA) 1 G capsule Take 1 g by mouth daily.   Yes Historical Provider, MD  omeprazole (PRILOSEC) 20 MG capsule Take 40 mg by mouth daily before breakfast.    Yes Historical Provider, MD  oxyCODONE (OXY IR/ROXICODONE) 5 MG immediate release tablet Take 2.5 mg by mouth every 6 (six) hours as needed for moderate pain or severe pain.   Yes Historical Provider, MD  sennosides-docusate sodium (SENOKOT-S) 8.6-50 MG tablet Take 1 tablet by mouth daily as needed for constipation.   Yes Historical Provider, MD  cephALEXin (KEFLEX) 500 MG capsule Take 1 capsule (500 mg total) by mouth 3 (three) times daily. 09/07/14   Jerelyn ScottMartha Linker, MD   BP 153/57 mmHg  Pulse 61  Temp(Src) 97.9 F (36.6 C) (Oral)  Resp 14  SpO2 95%  Vitals reviewed Physical Exam  Physical Examination: General appearance - alert, well appearing, and in no distress Mental status - alert, oriented to person only Head- NCAT Eyes - pupils equal and reactive, extraocular eye movements intact Neck - diffuse midline and paraspinal tenderness Chest - clear to auscultation, no wheezes, rales or rhonchi, symmetric air  entry Heart - normal rate, regular rhythm, normal S1, S2, no murmurs, rubs, clicks or gallops Abdomen - soft, nontender, nondistended, no masses or organomegaly Back exam - no midline tenderness to palpation, kyphosis Neurological - alert, oriented x 1, no cranial nerve defect, strength 5/5 in extremities x 4, sensation intact Musculoskeletal - no joint tenderness, deformity or swelling Extremities - peripheral pulses normal, no pedal edema, no clubbing or cyanosis Skin - normal coloration and turgor, no rashes  ED Course  Procedures (including critical care time)  6:41 PM called to patient's room as she had fallen in the  floor.  Pt does not remember the fall.  New skin tear on left wrist.  No new back or neck tenderness.  Concern that she has hit her head again.  Will get another head CT given new trauma to rule out subdural.  Continuing to await urine- pt has been incontinent and we have not been able to get urine sample as of yet.  Pt was able to bear weight after fall.    10:34 PM unable to get urine sample despite multiple cath attempts.  Pt has had 2 episodes of urine output but not able to collect sample.  At this point I feel UTI is likely due to elevated WBC and multiple falls.  Will start on keflex.  Will need to have urine sample obtained at nursing facility.  Labs Review Labs Reviewed  CBC - Abnormal; Notable for the following:    WBC 12.6 (*)    RBC 3.82 (*)    Hemoglobin 11.5 (*)    HCT 35.7 (*)    All other components within normal limits  BASIC METABOLIC PANEL - Abnormal; Notable for the following:    Glucose, Bld 107 (*)    BUN 26 (*)    Creatinine, Ser 1.18 (*)    GFR calc non Af Amer 37 (*)    GFR calc Af Amer 43 (*)    All other components within normal limits  URINALYSIS, ROUTINE W REFLEX MICROSCOPIC    Imaging Review Dg Wrist Complete Left  09/07/2014   CLINICAL DATA:  Unwitnessed fall, found in bathroom  EXAM: LEFT WRIST - COMPLETE 3+ VIEW  COMPARISON:  None  FINDINGS: Osseous demineralization.  Degenerative changes at first [facility] joint and at distal pole scaphoid.  Remaining joint spaces fairly well preserved.  No acute fracture, dislocation, or bone destruction.  IMPRESSION: Osseous demineralization with degenerative changes at first [facility] and STT joints.  No acute abnormalities.   Electronically Signed   By: Ulyses Southward M.D.   On: 09/07/2014 19:23   Ct Head Wo Contrast  09/07/2014   CLINICAL DATA:  Unwitnessed fall since being in the Emergency Department earlier today  EXAM: CT HEAD WITHOUT CONTRAST  TECHNIQUE: Contiguous axial images were obtained from the base of the skull through the  vertex without intravenous contrast.  COMPARISON:  Earlier exam of 09/07/2014  FINDINGS: Generalized atrophy.  Normal ventricular morphology.  No midline shift or mass effect.  Small vessel chronic ischemic changes in deep cerebral white matter.  No intracranial hemorrhage, mass lesion, evidence acute infarction or extra-axial fluid collection.  RIGHT occipital scalp hematoma again identified.  Minimal mucosal thickening in the maxillary sinuses.  Bones demineralized.  IMPRESSION: Atrophy with small vessel chronic ischemic changes of deep cerebral white matter.  No acute intracranial abnormalities.  No interval change.   Electronically Signed   By: Ulyses Southward M.D.   On: 09/07/2014 19:21  Ct Head Wo Contrast  09/07/2014   CLINICAL DATA:  Unwitnessed fall in a bathroom. Head injury. Neck and back pain. Dementia.  EXAM: CT HEAD WITHOUT CONTRAST  CT CERVICAL SPINE WITHOUT CONTRAST  TECHNIQUE: Multidetector CT imaging of the head and cervical spine was performed following the standard protocol without intravenous contrast. Multiplanar CT image reconstructions of the cervical spine were also generated.  COMPARISON:  06/06/2014  FINDINGS: CT HEAD FINDINGS  The brainstem, cerebellum, cerebral peduncles, thalamus, basal ganglia, basilar cisterns, and ventricular system appear within normal limits. No intracranial hemorrhage, mass lesion, or acute CVA. Periventricular white matter and corona radiata hypodensities favor chronic ischemic microvascular white matter disease. Right occipital parietal scalp hematoma. No underlying calvarial fracture.  Chronic bilateral maxillary sinusitis.  CT CERVICAL SPINE FINDINGS  Stable cervical spondylosis and degenerative disc disease with loss of intervertebral disc height at all levels between C3 and C7, and osseous foraminal stenosis on the left at C3-4 due to uncinate and facet spurring. Again noted are disc bulges at the C2-3 and C3-4 levels. Articulating anterior spurs are again  observed at C4-5 and C5-6.  No cervical spine fracture or malalignment is identified.  IMPRESSION: 1. No acute intracranial findings are acute cervical spine findings. 2. Hematoma in the right occipital parietal scalp. 3. Chronic bilateral maxillary sinusitis. 4. Cervical spondylosis and degenerative disc disease.   Electronically Signed   By: Gaylyn Rong M.D.   On: 09/07/2014 17:05   Ct Cervical Spine Wo Contrast  09/07/2014   CLINICAL DATA:  Unwitnessed fall in a bathroom. Head injury. Neck and back pain. Dementia.  EXAM: CT HEAD WITHOUT CONTRAST  CT CERVICAL SPINE WITHOUT CONTRAST  TECHNIQUE: Multidetector CT imaging of the head and cervical spine was performed following the standard protocol without intravenous contrast. Multiplanar CT image reconstructions of the cervical spine were also generated.  COMPARISON:  06/06/2014  FINDINGS: CT HEAD FINDINGS  The brainstem, cerebellum, cerebral peduncles, thalamus, basal ganglia, basilar cisterns, and ventricular system appear within normal limits. No intracranial hemorrhage, mass lesion, or acute CVA. Periventricular white matter and corona radiata hypodensities favor chronic ischemic microvascular white matter disease. Right occipital parietal scalp hematoma. No underlying calvarial fracture.  Chronic bilateral maxillary sinusitis.  CT CERVICAL SPINE FINDINGS  Stable cervical spondylosis and degenerative disc disease with loss of intervertebral disc height at all levels between C3 and C7, and osseous foraminal stenosis on the left at C3-4 due to uncinate and facet spurring. Again noted are disc bulges at the C2-3 and C3-4 levels. Articulating anterior spurs are again observed at C4-5 and C5-6.  No cervical spine fracture or malalignment is identified.  IMPRESSION: 1. No acute intracranial findings are acute cervical spine findings. 2. Hematoma in the right occipital parietal scalp. 3. Chronic bilateral maxillary sinusitis. 4. Cervical spondylosis and  degenerative disc disease.   Electronically Signed   By: Gaylyn Rong M.D.   On: 09/07/2014 17:05     EKG Interpretation   Date/Time:  Friday September 07 2014 16:22:03 EST Ventricular Rate:  69 PR Interval:  174 QRS Duration: 86 QT Interval:  439 QTC Calculation: 470 R Axis:   -52 Text Interpretation:  Sinus rhythm Left anterior fascicular block Baseline  wander in lead(s) III aVF No significant change since last tracing  Confirmed by Karma Ganja  MD, Kyarra Vancamp [phone]) on 09/07/2014 4:33:26 PM      MDM   Final diagnoses:  Fall, initial encounter  Tear of skin of wrist, left, initial encounter  Leukocytosis  Pt presenting with c/o unwitnessed fall.  She has hx of dementia and per report is at her baseline.  EKG reassuring, labs reassuring.  She did have another fall while in the ED room- after this fall she has skin tear of left wrist.  Repeat head CT negative and wrist films negative as well.  During her 7 hour ED stay she had 2 episodes of urine output, but these were not able to be obtained for urine sample.  She does have some elevation in wbc so will presumptively treat for UTI, I feel it is in her best interest to go back to her nursing facility at this time and should have urine culture obtained there.  Discharged to take a course of keflex.      Jerelyn Scott, MD 09/07/14 [phone]

## 2014-09-07 NOTE — Progress Notes (Addendum)
CSW attempted to meet with pt. However, she was asleep. There was no family present. CSW checked pt's chart, which [facility].  Trish MageBrittney Kharlie Bring, LCSWA [phone] ED CSW 09/07/2014 9:19 PM

## 2014-09-07 NOTE — ED Notes (Signed)
Patient transported to CT 

## 2014-09-07 NOTE — ED Notes (Signed)
Attempted to in and out cath the pt and she had already uriniated on herself, will try again later

## 2014-09-07 NOTE — ED Notes (Signed)
Pt from [facility]. Unwitnessed fall, found in bathroom, staff heard noise, went to check on pt. Reports no LOC. Unsure what pt hit head on. EMS denies visible injury. Pt c/o neck, back pain, HA. Full ROM intact. [facility] in [facility]. Could not tolerate c-collar so towel placed to reduce neck movement.

## 2014-09-07 NOTE — ED Notes (Signed)
[facility] called for transport.  

## 2014-09-07 NOTE — ED Notes (Signed)
Bed: WHALA Expected date:  Expected time:  Means of arrival:  Comments: 

## 2014-09-07 NOTE — ED Notes (Signed)
Patient had two incontinent episodes. In and out cath attempted x3. No urine.

## 2014-09-07 NOTE — Progress Notes (Signed)
Patient found lying on floor during rounding by author. Attending and nursing staff called to bedside to assess patient prior to movement.

## 2014-09-07 NOTE — Discharge Instructions (Signed)
Return to the ED with any concerns including vomiting and not able to keep down liquids or antibiotics, fever/chills, fainting, decreased level of alertness/lethargy, or any other alarming symptoms   Pt will need a urine culture obtained at the nursing facility as soon as possible

## 2014-09-07 NOTE — ED Notes (Signed)
Bed: [id number] Expected date:  Expected time:  Means of arrival:  Comments: For hall a

## 2014-09-29 ENCOUNTER — Encounter (HOSPITAL_COMMUNITY): Payer: Self-pay | Admitting: General Practice

## 2014-09-29 ENCOUNTER — Emergency Department (HOSPITAL_COMMUNITY)
Admission: EM | Admit: 2014-09-29 | Discharge: 2014-09-29 | Disposition: A | Discharge status: Home / Self Care | Payer: Medicare Other | Attending: Emergency Medicine | Admitting: Emergency Medicine

## 2014-09-29 ENCOUNTER — Emergency Department (HOSPITAL_COMMUNITY): Payer: Medicare Other

## 2014-09-29 DIAGNOSIS — S0101XA Laceration without foreign body of scalp, initial encounter: Secondary | ICD-10-CM | POA: Insufficient documentation

## 2014-09-29 DIAGNOSIS — Y9389 Activity, other specified: Secondary | ICD-10-CM | POA: Insufficient documentation

## 2014-09-29 DIAGNOSIS — M1990 Unspecified osteoarthritis, unspecified site: Secondary | ICD-10-CM | POA: Insufficient documentation

## 2014-09-29 DIAGNOSIS — Y998 Other external cause status: Secondary | ICD-10-CM | POA: Insufficient documentation

## 2014-09-29 DIAGNOSIS — Z9181 History of falling: Secondary | ICD-10-CM | POA: Insufficient documentation

## 2014-09-29 DIAGNOSIS — Z792 Long term (current) use of antibiotics: Secondary | ICD-10-CM | POA: Insufficient documentation

## 2014-09-29 DIAGNOSIS — I10 Essential (primary) hypertension: Secondary | ICD-10-CM | POA: Insufficient documentation

## 2014-09-29 DIAGNOSIS — S51012A Laceration without foreign body of left elbow, initial encounter: Secondary | ICD-10-CM

## 2014-09-29 DIAGNOSIS — Z79899 Other long term (current) drug therapy: Secondary | ICD-10-CM | POA: Insufficient documentation

## 2014-09-29 DIAGNOSIS — S51002A Unspecified open wound of left elbow, initial encounter: Secondary | ICD-10-CM | POA: Insufficient documentation

## 2014-09-29 DIAGNOSIS — K219 Gastro-esophageal reflux disease without esophagitis: Secondary | ICD-10-CM | POA: Insufficient documentation

## 2014-09-29 DIAGNOSIS — S61402A Unspecified open wound of left hand, initial encounter: Secondary | ICD-10-CM | POA: Insufficient documentation

## 2014-09-29 DIAGNOSIS — W01198A Fall on same level from slipping, tripping and stumbling with subsequent striking against other object, initial encounter: Secondary | ICD-10-CM | POA: Insufficient documentation

## 2014-09-29 DIAGNOSIS — Y9289 Other specified places as the place of occurrence of the external cause: Secondary | ICD-10-CM | POA: Insufficient documentation

## 2014-09-29 DIAGNOSIS — F0390 Unspecified dementia without behavioral disturbance: Secondary | ICD-10-CM | POA: Insufficient documentation

## 2014-09-29 DIAGNOSIS — E039 Hypothyroidism, unspecified: Secondary | ICD-10-CM | POA: Insufficient documentation

## 2014-09-29 DIAGNOSIS — W19XXXA Unspecified fall, initial encounter: Secondary | ICD-10-CM

## 2014-09-29 MED ORDER — LIDOCAINE-EPINEPHRINE (PF) 2 %-1:200000 IJ SOLN
10.0000 mL | Freq: Once | INTRAMUSCULAR | Status: AC
  Administered 2014-09-29: 10 mL
  Filled 2014-09-29: qty 20

## 2014-09-29 MED ORDER — TETANUS-DIPHTH-ACELL PERTUSSIS 5-2.5-18.5 LF-MCG/0.5 IM SUSP
0.5000 mL | Freq: Once | INTRAMUSCULAR | Status: DC
Start: 2014-09-29 — End: 2014-09-29

## 2014-09-29 NOTE — ED Notes (Signed)
[facility] regarding pt scans of head, c-spine, and DG's of elbow and hand - nothing acute noted in impression. Results had not crossed over in EPIC. PA Heather aware, recommends removing c-collar.

## 2014-09-29 NOTE — ED Notes (Signed)
Patient seems confused on a lot of points.  Doesn't know that she already went to x-ray.  Didn't know that she already pottied in her diaper.  Doesn't know the blood pressure.  Is this her normal?

## 2014-09-29 NOTE — ED Provider Notes (Signed)
CSN: [id number]     Arrival date & time    History   First MD Initiated Contact with Patient 09/29/14 [phone]     Chief Complaint  Patient presents with  . Fall     (Consider location/radiation/quality/duration/timing/severity/associated sxs/prior Treatment) HPI Comments: LEVEL V CAVEAT applies due to dementia  Patient with a history of Dementia presents today from [facility] due to a fall.  Fall was witnessed by staff.  She was ambulating without her walker and tripped and fell.  She fell backwards and hit her head.  No LOC.  She has a history of frequent falls and often forgets to use her walker to ambulate as recommended.  She sustained a laceration to the posterior scalp from this fall.  She reports a headache at this time.  She also reports soreness of her left elbow and left hand.  She denies neck or back pain.  Denies SOB or chest pain.  Review of the chart shows that the patient's Tetanus is UTD.  Review of her medications shows that she is not on anticoagulants.    Patient is a 81 y.o. female presenting with fall.  Fall    Past Medical History  Diagnosis Date  . Dementia   . Hypothyroid   . Arthritis   . Hypertension   . GERD (gastroesophageal reflux disease)   . Diverticulitis    History reviewed. No pertinent past surgical history. No family history on file. History  Substance Use Topics  . Smoking status: Never Smoker   . Smokeless tobacco: Not on file  . Alcohol Use: No   OB History    No data available     Review of Systems  Unable to perform ROS: Dementia      Allergies  Aricept; Codeine; Hydrocodone; Meloxicam; and Sulfa antibiotics  Home Medications   Prior to Admission medications   Medication Sig Start Date End Date Taking? Authorizing Provider  acetaminophen (TYLENOL) 500 MG tablet Take 500 mg by mouth every 6 (six) hours. Take 1 tablet every 6 hours for 1 month (started 8/19)    Historical Provider, MD  cephALEXin (KEFLEX) 500 MG  capsule Take 1 capsule (500 mg total) by mouth 3 (three) times daily. 09/07/14   Jerelyn Scott, MD  Cholecalciferol (VITAMIN D3) 2000 UNITS capsule Take 2,000 Units by mouth daily.    Historical Provider, MD  divalproex (DEPAKOTE) 125 MG DR tablet Take 125 mg by mouth 2 (two) times daily.  09/05/14   Historical Provider, MD  levothyroxine (SYNTHROID, LEVOTHROID) 50 MCG tablet Take 50 mcg by mouth daily before breakfast.     Historical Provider, MD  LORazepam (ATIVAN) 0.5 MG tablet Take 0.5 mg by mouth See admin instructions. Take three times a day plus an additional tablet every 6 hours as need for agitation    Historical Provider, MD  LORazepam (ATIVAN) 0.5 MG tablet Take 0.5 mg by mouth 3 (three) times daily.     Historical Provider, MD  Melatonin 5 MG CAPS Take 1 capsule by mouth at bedtime.     Historical Provider, MD  omega-3 acid ethyl esters (LOVAZA) 1 G capsule Take 1 g by mouth daily.    Historical Provider, MD  omeprazole (PRILOSEC) 20 MG capsule Take 40 mg by mouth daily before breakfast.     Historical Provider, MD  oxyCODONE (OXY IR/ROXICODONE) 5 MG immediate release tablet Take 2.5 mg by mouth every 6 (six) hours as needed for moderate pain or severe pain.  Historical Provider, MD  sennosides-docusate sodium (SENOKOT-S) 8.6-50 MG tablet Take 1 tablet by mouth daily as needed for constipation.    Historical Provider, MD   BP 157/68 mmHg  Pulse 74  Temp(Src) 98 F (36.7 C) (Oral)  Resp 16  Ht  (1.727 m)  Wt 130 lb (58.968 kg)  BMI 19.77 kg/m2 Physical Exam  Constitutional: She appears well-developed and well-nourished.  HENT:  Head: Normocephalic and atraumatic.    Eyes: EOM are normal. Pupils are equal, round, and reactive to light.  Neck: Neck supple.  C-collar in place  Cardiovascular: Normal rate, regular rhythm and normal heart sounds.   Pulmonary/Chest: Effort normal and breath sounds normal.  Abdominal: Soft. Bowel sounds are normal. She exhibits no distension  and no mass. There is no tenderness. There is no rebound and no guarding.  Musculoskeletal: Normal range of motion.       Thoracic back: She exhibits no tenderness, no bony tenderness, no swelling, no edema and no deformity.       Lumbar back: She exhibits no tenderness, no bony tenderness, no swelling, no edema and no deformity.  Full ROM of lower extremities without pain Some tenderness to palpation and bruising of the left hand and left elbow.  Full ROM of the left wrist and shoulder.  Full ROM of the RUE.  Neurological: She is alert. She has normal strength. No cranial nerve deficit or sensory deficit.  Patient alert to person and place, but not time  Skin: Skin is warm and dry.  Small skin tear to the left hand and also a skin tear of the left elbow  Psychiatric: She has a normal mood and affect.  Nursing note and vitals reviewed.   ED Course  Procedures (including critical care time) Labs Review Labs Reviewed - No data to display  Imaging Review No results found.   EKG Interpretation   Date/Time:  Saturday September 29 2014 09:39:46 EDT Ventricular Rate:  73 PR Interval:  163 QRS Duration: 87 QT Interval:  467 QTC Calculation: 515 R Axis:   -39 Text Interpretation:  sinus rhythm with irregular rate Left axis deviation  rate more irregular compared to prior tracing 09/07/2014 Confirmed by  Mirian Mo [phone]) on 09/29/2014 10:40:36 AM     LACERATION REPAIR Performed by: Santiago Glad Authorized by: Santiago Glad Consent: Verbal consent obtained. Risks and benefits: risks, benefits and alternatives were discussed Consent given by: patient Patient identity confirmed: provided demographic data Prepped and Draped in normal sterile fashion Wound explored  Laceration Location: posterior scalp  Laceration Length: 3.5 cm  No Foreign Bodies seen or palpated  Anesthesia: local infiltration  Local anesthetic: lidocaine 2% with epinephrine  Anesthetic total: 2  ml  Irrigation method: syringe Amount of cleaning: standard  Skin closure: staples  Number of staples: 5  Technique: staples  Patient tolerance: Patient tolerated the procedure well with no immediate complications.  MDM   Final diagnoses:  None   Patient presents today from [facility] after she had a mechanical fall just prior to arrival.  She has a history of frequent falls.  Staff at Nursing Home report that the patient is supposed to ambulate with a walker, but has dementia and often forgets to use her walker.  Fall was witnessed by staff who report no LOC.  She is not on anticoagulants.  She does have a laceration to her posterior scalp.  Laceration stapled in the ED.  CT head and cervical spine were negative.  Report was not crossing over to Epic, but negative results confirmed with Radiology.  Patient also with negative xrays of her hand and elbow.  Review of the chart shows that her Tetanus was updated last ED visit 09/07/14 after a fall.  Patient with full ROM of lower extremities without pain.  No spinal tenderness on exam.  Feel that she is stable for discharge back to Nursing Home.  Return precautions given to the staff there.  Patient also evaluated by Dr. Littie DeedsGentry who is in agreement with the plan.    Santiago GladHeather Jerelene Salaam, PA-C 09/29/14 1619  Santiago GladHeather Yarah Fuente, PA-C 09/29/14 1620  Mirian MoMatthew Gentry, MD 10/02/14 [phone]

## 2014-09-29 NOTE — Discharge Instructions (Signed)
The CT scan of your head and neck and also the xray of your left hand and left elbow did not show anything new.  It is recommended that you have the staples removed in 10 days.

## 2014-09-29 NOTE — ED Notes (Signed)
Pt brought in via [facility] after [facility]. Pt was visiting friends within the facility and fell backwards hitting her head. Pt is A/O. Pt did not have a loss of consciousness. Pt has a laceration to the back of her head, and a left arm skin tear. EMS V/S 180/90, HR 66, CBG 92.

## 2015-08-21 ENCOUNTER — Encounter (HOSPITAL_COMMUNITY): Payer: Self-pay | Admitting: Emergency Medicine

## 2015-08-21 ENCOUNTER — Emergency Department (HOSPITAL_COMMUNITY): Payer: Medicare Other

## 2015-08-21 ENCOUNTER — Emergency Department (HOSPITAL_COMMUNITY)
Admission: EM | Admit: 2015-08-21 | Discharge: 2015-08-21 | Disposition: A | Discharge status: Home / Self Care | Payer: Medicare Other | Source: Home / Self Care | Attending: Emergency Medicine | Admitting: Emergency Medicine

## 2015-08-21 DIAGNOSIS — K219 Gastro-esophageal reflux disease without esophagitis: Secondary | ICD-10-CM

## 2015-08-21 DIAGNOSIS — J9811 Atelectasis: Secondary | ICD-10-CM

## 2015-08-21 DIAGNOSIS — F0390 Unspecified dementia without behavioral disturbance: Secondary | ICD-10-CM

## 2015-08-21 DIAGNOSIS — I10 Essential (primary) hypertension: Secondary | ICD-10-CM | POA: Insufficient documentation

## 2015-08-21 DIAGNOSIS — M1990 Unspecified osteoarthritis, unspecified site: Secondary | ICD-10-CM

## 2015-08-21 DIAGNOSIS — E039 Hypothyroidism, unspecified: Secondary | ICD-10-CM

## 2015-08-21 DIAGNOSIS — Z79899 Other long term (current) drug therapy: Secondary | ICD-10-CM | POA: Insufficient documentation

## 2015-08-21 LAB — BASIC METABOLIC PANEL
ANION GAP: 11 (ref 5–15)
BUN: 15 mg/dL (ref 6–20)
CALCIUM: 8.1 mg/dL — AB (ref 8.9–10.3)
CO2: 27 mmol/L (ref 22–32)
Chloride: 98 mmol/L — ABNORMAL LOW (ref 101–111)
Creatinine, Ser: 1.03 mg/dL — ABNORMAL HIGH (ref 0.44–1.00)
GFR calc non Af Amer: 44 mL/min — ABNORMAL LOW (ref >60)
GFR, EST AFRICAN AMERICAN: 51 mL/min — AB (ref >60)
GLUCOSE: 111 mg/dL — AB (ref 65–99)
POTASSIUM: 3.6 mmol/L (ref 3.5–5.1)
Sodium: 136 mmol/L (ref 135–145)

## 2015-08-21 LAB — CBC WITH DIFFERENTIAL/PLATELET
BASOS ABS: 0 10*3/uL (ref 0.0–0.1)
BASOS PCT: 0 %
Eosinophils Absolute: 0 10*3/uL (ref 0.0–0.7)
Eosinophils Relative: 0 %
HEMATOCRIT: 36.6 % (ref 36.0–46.0)
HEMOGLOBIN: 11.8 g/dL — AB (ref 12.0–15.0)
LYMPHS PCT: 20 %
Lymphs Abs: 1.2 10*3/uL (ref 0.7–4.0)
MCH: 28.4 pg (ref 26.0–34.0)
MCHC: 32.2 g/dL (ref 30.0–36.0)
MCV: 88.2 fL (ref 78.0–100.0)
Monocytes Absolute: 1 10*3/uL (ref 0.1–1.0)
Monocytes Relative: 17 %
NEUTROS ABS: 3.6 10*3/uL (ref 1.7–7.7)
NEUTROS PCT: 63 %
Platelets: 199 10*3/uL (ref 150–400)
RBC: 4.15 MIL/uL (ref 3.87–5.11)
RDW: 15.4 % (ref 11.5–15.5)
WBC: 5.7 10*3/uL (ref 4.0–10.5)

## 2015-08-21 NOTE — ED Provider Notes (Signed)
CSN: [id number]     Arrival date & time 08/21/15  1123 History   First MD Initiated Contact with Patient 08/21/15 1152     Chief Complaint  Patient presents with  . Pneumonia      HPI Patient was brought in with report from [facility] that she had a cough and not feeling as well is normal.  Had an x-ray done which was questionable for pneumonia.  Patient satting 98-97% on room air.  She is unsure why she is here. Past Medical History  Diagnosis Date  . Dementia   . Hypothyroid   . Arthritis   . Hypertension   . GERD (gastroesophageal reflux disease)   . Diverticulitis    History reviewed. No pertinent past surgical history. No family history on file. Social History  Substance Use Topics  . Smoking status: Never Smoker   . Smokeless tobacco: None  . Alcohol Use: No   OB History    No data available     Review of Systems  Unable to perform ROS: Dementia      Allergies  Aricept; Codeine; Hydrocodone; Meloxicam; and Sulfa antibiotics  Home Medications   Prior to Admission medications   Medication Sig Start Date End Date Taking? Authorizing Provider  acetaminophen (TYLENOL) 500 MG tablet Take 500 mg by mouth every 4 (four) hours as needed for mild pain. Take 1 tablet every 6 hours for 1 month (started 8/19)   Yes Historical Provider, MD  Chloroxylenol-Zinc Oxide (BAZA EX) Apply 1 application topically daily as needed (to redness on buttocks).   Yes Historical Provider, MD  Cholecalciferol (VITAMIN D3) 2000 UNITS capsule Take 2,000 Units by mouth daily.   Yes Historical Provider, MD  divalproex (DEPAKOTE) 125 MG DR tablet Take 125 mg by mouth 2 (two) times daily.  09/05/14  Yes Historical Provider, MD  hydrochlorothiazide (MICROZIDE) 12.5 MG capsule Take 12.5 mg by mouth daily.   Yes Historical Provider, MD  levothyroxine (SYNTHROID, LEVOTHROID) 50 MCG tablet Take 50 mcg by mouth daily before breakfast.    Yes Historical Provider, MD  loperamide (IMODIUM A-D) 2 MG tablet  Take 2-4 mg by mouth 4 (four) times daily as needed for diarrhea or loose stools (take  after first loose stool, then take  after each subsequent loose stool, no more than / 24 hrs).   Yes Historical Provider, MD  Melatonin 5 MG CAPS Take 1 capsule by mouth at bedtime.    Yes Historical Provider, MD  neomycin-bacitracin-polymyxin (NEOSPORIN) [phone] ointment Apply 1 application topically 2 (two) times a week. Cleanse lower forearm with NS, Apply ointment and cover with gauze, not applied by nursing staff   Yes Historical Provider, MD  nystatin cream (MYCOSTATIN) Apply 1 application topically 4 (four) times daily as needed for dry skin (until healed).   Yes Historical Provider, MD  omega-3 acid ethyl esters (LOVAZA) 1 G capsule Take 1 g by mouth daily.   Yes Historical Provider, MD  Omega-3 Fatty Acids (FISH OIL) 1000 MG CAPS Take 1,000 mg by mouth daily.   Yes Historical Provider, MD  omeprazole (PRILOSEC) 20 MG capsule Take 20 mg by mouth daily before breakfast.    Yes Historical Provider, MD  risperiDONE (RISPERDAL) 0.25 MG tablet Take 0.25 mg by mouth at bedtime. And may take 1 tablet BID PRN for aggitation   Yes Historical Provider, MD  sennosides-docusate sodium (SENOKOT-S) 8.6-50 MG tablet Take 1 tablet by mouth 2 (two) times daily as needed for constipation.  Yes Historical Provider, MD  Skin Protectants, Misc. (EUCERIN) cream Apply 1 application topically every morning. Apply to legs   Yes Historical Provider, MD  sodium chloride irrigation 0.9 % irrigation Irrigate with 1 application as directed 3 (three) times a week. cleanse open areas  And pat dry, then wrap with conform gauze   Yes Historical Provider, MD  Sodium Phosphates (FLEET ENEMA RE) Place 1 application rectally daily as needed (if no BM in 3 days).   Yes Historical Provider, MD  Wound Dressings (CARRASYN HYDROGEL WOUND DRESS EX) Apply 1 application topically 3 (three) times a week. Apply to wound after cleansing three  times per week, per Nursing home, not applied by staff   Yes Historical Provider, MD   BP 144/62 mmHg  Pulse 62  Temp(Src) 97.7 F (36.5 C) (Oral)  Resp 18  SpO2 98% Physical Exam  Constitutional: She is oriented to person, place, and time. She appears well-developed and well-nourished. No distress.  HENT:  Head: Normocephalic and atraumatic.  Eyes: Pupils are equal, round, and reactive to light.  Neck: Normal range of motion.  Cardiovascular: Normal rate and intact distal pulses.   Pulmonary/Chest: No accessory muscle usage. No respiratory distress.  Scattered rhonchi in all bases.  Abdominal: Normal appearance. She exhibits no distension.  Musculoskeletal: Normal range of motion.  Neurological: She is alert and oriented to person, place, and time. No cranial nerve deficit.  Skin: Skin is warm and dry. No rash noted.  Psychiatric: She has a normal mood and affect. Her behavior is normal.  Nursing note and vitals reviewed.   ED Course  Procedures (including critical care time) Labs Review Labs Reviewed  CBC WITH DIFFERENTIAL/PLATELET - Abnormal; Notable for the following:    Hemoglobin 11.8 (*)    All other components within normal limits  BASIC METABOLIC PANEL - Abnormal; Notable for the following:    Chloride 98 (*)    Glucose, Bld 111 (*)    Creatinine, Ser 1.03 (*)    Calcium 8.1 (*)    GFR calc non Af Amer 44 (*)    GFR calc Af Amer 51 (*)    All other components within normal limits  CULTURE, BLOOD (ROUTINE X 2)  CULTURE, BLOOD (ROUTINE X 2)    Imaging Review Dg Chest 2 View  08/21/2015  CLINICAL DATA:  Cough today. The patient was diagnosed with pneumonia yesterday. Initial encounter. EXAM: CHEST  2 VIEW COMPARISON:  PA and lateral chest 12/09/2013. FINDINGS: The patient is status post right mastectomy and axillary dissection. Lung volumes are lower than on the comparison study. There is left basilar airspace disease. The right lung appears clear. No pneumothorax  or pleural effusion is identified. Heart size is upper normal. The patient is rotated on the study. IMPRESSION: Left basilar airspace disease which could be due to atelectasis in this low volume chest or pneumonia. Electronically Signed   By: Drusilla Kanner M.D.   On: 08/21/2015 13:08   I have personally reviewed and evaluated these images and lab results as part of my medical decision-making.  Patient has a normal white blood cell count of 5700.  This is not consistent with pneumonia.  No fever documented.  Patient will be discharged back to care facility and return if she develops fever or new symptoms.  MDM   Final diagnoses:  Atelectasis        Nelva Nay, MD 08/21/15 1335

## 2015-08-21 NOTE — Discharge Instructions (Signed)
Atelectasis, Adult °Atelectasis is a collapse of the small air sacs in the lungs (alveoli). When this occurs, all or part of a lung collapses and becomes airless. It can be caused by various things and is a common problem after surgery. The severity of atelectasis will vary depending on the size of the area involved and the underlying cause of the condition. °CAUSES  °There are multiple causes for atelectasis:  °· Shallow breathing, particularly if there is an injury to your chest wall or abdomen that makes it painful to take a deep breath. This commonly occurs after surgery. °· Obstruction of your airways (bronchi or bronchioles). This may be caused by a buildup of mucus (mucus plug), tumors, blood clots (pulmonary embolus), or inhaled foreign bodies. Mucus plugs occur when the lungs do not expand enough to get rid of mucus. °· Outside pressure on the lung. This may be caused by tumors, fluid (pleural effusion), or a leakage of air between the lung and rib cage (pneumothorax).   °· Infections such as pneumonia. °· Scarring in lung tissue left over from previous infection or injury. °· Some diseases such as cystic fibrosis. °SIGNS AND SYMPTOMS  °Often, atelectasis will have no symptoms. When symptoms occur, they include: °· Shortness of breath.   °· Bluish color to your nails, lips, or mouth (cyanosis). °DIAGNOSIS  °Your health care provider may suspect atelectasis based on symptoms and physical findings. A chest X-ray may be done to confirm the diagnosis. More specialized X-ray exams are sometimes required.  °TREATMENT  °Treatment will depend on the cause of the atelectasis. Treatment may include: °· Purposeful coughing to loosen mucus plugs in the lungs. °· Chest physiotherapy. This consists of clapping or percussion on the chest over the lungs to further loosen mucus plugs. °· Postural drainage techniques. This involves positioning your body so your head is lower than your chest. °HOME CARE  INSTRUCTIONS °· Practice relaxed deep breathing whenever you are sitting down. A good technique is to take a few relaxed deep breaths each time a commercial comes on if you are watching television. °· If you were given a deep breathing device (such as an incentive spirometer) or a mucus clearance device, use this regularly as directed by your health care provider. °· Try to cough several times a day as directed by your health care provider. °· Perform any chest physiotherapy or postural drainage techniques as directed by your health care provider. If necessary, have someone (such as a family member) assist you with these techniques. °· When lying down, lie on the unaffected side to encourage mucus drainage. °· Stay physically active as much as possible. °SEEK IMMEDIATE MEDICAL CARE IF:  °· You develop increasing problems with your breathing.   °· You develop severe chest pain.   °· You develop severe coughing, or you cough up blood.   °· You have a fever or persistent symptoms for more than 2-3 days.   °· You have a fever and your symptoms suddenly get worse.   °MAKE SURE YOU: °· Understand these instructions. °· Will watch your condition. °· Will get help right away if you are not doing well or get worse. °  °This information is not intended to replace advice given to you by your health care provider. Make sure you discuss any questions you have with your health care provider. °  °Document Released: 06/15/2005 Document Revised: 07/06/2014 Document Reviewed: 12/21/2012 °Elsevier Interactive Patient Education ©2016 [employer] ° °

## 2015-08-21 NOTE — ED Notes (Signed)
Pt via [facility] with c/o of not feeling well,reports a cough.Yesterday dx with pneumonia by chest xray. A/o

## 2015-08-22 ENCOUNTER — Telehealth: Payer: Self-pay | Admitting: *Deleted

## 2015-08-22 ENCOUNTER — Emergency Department (HOSPITAL_COMMUNITY): Payer: Medicare Other

## 2015-08-22 ENCOUNTER — Inpatient Hospital Stay (HOSPITAL_COMMUNITY)
Admission: EM | Admit: 2015-08-22 | Discharge: 2015-08-26 | DRG: 193 | Disposition: A | Discharge status: Nursing Home (Includes ICF) | Payer: Medicare Other | Attending: Internal Medicine | Admitting: Internal Medicine

## 2015-08-22 DIAGNOSIS — J189 Pneumonia, unspecified organism: Principal | ICD-10-CM | POA: Diagnosis present

## 2015-08-22 DIAGNOSIS — I10 Essential (primary) hypertension: Secondary | ICD-10-CM | POA: Diagnosis present

## 2015-08-22 DIAGNOSIS — R001 Bradycardia, unspecified: Secondary | ICD-10-CM | POA: Diagnosis present

## 2015-08-22 DIAGNOSIS — M1990 Unspecified osteoarthritis, unspecified site: Secondary | ICD-10-CM | POA: Diagnosis present

## 2015-08-22 DIAGNOSIS — Z882 Allergy status to sulfonamides status: Secondary | ICD-10-CM

## 2015-08-22 DIAGNOSIS — T501X5A Adverse effect of loop [high-ceiling] diuretics, initial encounter: Secondary | ICD-10-CM | POA: Diagnosis present

## 2015-08-22 DIAGNOSIS — K219 Gastro-esophageal reflux disease without esophagitis: Secondary | ICD-10-CM | POA: Diagnosis present

## 2015-08-22 DIAGNOSIS — Z888 Allergy status to other drugs, medicaments and biological substances status: Secondary | ICD-10-CM

## 2015-08-22 DIAGNOSIS — R7881 Bacteremia: Secondary | ICD-10-CM

## 2015-08-22 DIAGNOSIS — I82409 Acute embolism and thrombosis of unspecified deep veins of unspecified lower extremity: Secondary | ICD-10-CM | POA: Diagnosis present

## 2015-08-22 DIAGNOSIS — F0390 Unspecified dementia without behavioral disturbance: Secondary | ICD-10-CM | POA: Diagnosis present

## 2015-08-22 DIAGNOSIS — R05 Cough: Secondary | ICD-10-CM | POA: Diagnosis not present

## 2015-08-22 DIAGNOSIS — I959 Hypotension, unspecified: Secondary | ICD-10-CM | POA: Diagnosis present

## 2015-08-22 DIAGNOSIS — E876 Hypokalemia: Secondary | ICD-10-CM | POA: Diagnosis present

## 2015-08-22 DIAGNOSIS — N179 Acute kidney failure, unspecified: Secondary | ICD-10-CM | POA: Diagnosis present

## 2015-08-22 DIAGNOSIS — R4182 Altered mental status, unspecified: Secondary | ICD-10-CM | POA: Diagnosis present

## 2015-08-22 DIAGNOSIS — Z66 Do not resuscitate: Secondary | ICD-10-CM | POA: Diagnosis present

## 2015-08-22 DIAGNOSIS — I110 Hypertensive heart disease with heart failure: Secondary | ICD-10-CM | POA: Diagnosis present

## 2015-08-22 DIAGNOSIS — Z885 Allergy status to narcotic agent status: Secondary | ICD-10-CM

## 2015-08-22 DIAGNOSIS — I82412 Acute embolism and thrombosis of left femoral vein: Secondary | ICD-10-CM | POA: Diagnosis present

## 2015-08-22 DIAGNOSIS — Y95 Nosocomial condition: Secondary | ICD-10-CM | POA: Diagnosis present

## 2015-08-22 DIAGNOSIS — G9340 Encephalopathy, unspecified: Secondary | ICD-10-CM | POA: Diagnosis present

## 2015-08-22 DIAGNOSIS — I4891 Unspecified atrial fibrillation: Secondary | ICD-10-CM | POA: Diagnosis present

## 2015-08-22 DIAGNOSIS — I5032 Chronic diastolic (congestive) heart failure: Secondary | ICD-10-CM | POA: Diagnosis present

## 2015-08-22 DIAGNOSIS — R0902 Hypoxemia: Secondary | ICD-10-CM

## 2015-08-22 DIAGNOSIS — Z79899 Other long term (current) drug therapy: Secondary | ICD-10-CM

## 2015-08-22 DIAGNOSIS — B958 Unspecified staphylococcus as the cause of diseases classified elsewhere: Secondary | ICD-10-CM | POA: Diagnosis present

## 2015-08-22 DIAGNOSIS — I824Z3 Acute embolism and thrombosis of unspecified deep veins of distal lower extremity, bilateral: Secondary | ICD-10-CM | POA: Diagnosis present

## 2015-08-22 DIAGNOSIS — M7989 Other specified soft tissue disorders: Secondary | ICD-10-CM

## 2015-08-22 DIAGNOSIS — I509 Heart failure, unspecified: Secondary | ICD-10-CM

## 2015-08-22 DIAGNOSIS — E039 Hypothyroidism, unspecified: Secondary | ICD-10-CM | POA: Diagnosis present

## 2015-08-22 NOTE — ED Notes (Signed)
PT very lethargic, but opened her eyes to my voice and can move her extremities. Pt is hard of hearing.

## 2015-08-22 NOTE — ED Notes (Addendum)
BIB EMS. Seen yesterday for pneumonia. From [facility]. Esther Hardy called from [place]. 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 [phone]  EMS vitals 90/ palp, 64HR, 12RR 89%, RA with audible wheezing.

## 2015-08-22 NOTE — ED Provider Notes (Signed)
CSN: [id number]     Arrival date & time 08/22/15  2222 History  By signing my name below, I, Linda Mccullough, attest that this documentation has been prepared under the direction and in the presence of Linda Razor, MD. Electronically Signed: Budd Mccullough, ED Scribe. 08/22/2015. 11:37 PM.      Chief Complaint  Patient presents with  . Positive blood culture   The history is provided by the patient. No language interpreter was used.   HPI Comments: Linda Mccullough is a 82 y.o. female with a PMHX of dementia, HTN, Hypothyroid, diverticulitis and GERD brought in by ambulance who presents to the Emergency Department due to positive blood cultures taken yesterday in the ED. She reports associated non-productive cough. She was seen yesterday for cough and diagnosed with PNA, and later informed that her blood cultures came back positive. Pt denies nausea and SOB.  Pt is allergic to Aricept, codeine, hydrocodone, meloxicam, and sulfa antibiotics.  Past Medical History  Diagnosis Date  . Dementia   . Hypothyroid   . Arthritis   . Hypertension   . GERD (gastroesophageal reflux disease)   . Diverticulitis    No past surgical history on file. No family history on file. Social History  Substance Use Topics  . Smoking status: Never Smoker   . Smokeless tobacco: Not on file  . Alcohol Use: No   OB History    No data available     Review of Systems  Respiratory: Positive for cough. Negative for shortness of breath.   Gastrointestinal: Negative for nausea.  All other systems reviewed and are negative.   Allergies  Aricept; Codeine; Hydrocodone; Meloxicam; and Sulfa antibiotics  Home Medications   Prior to Admission medications   Medication Sig Start Date End Date Taking? Authorizing Provider  acetaminophen (TYLENOL) 500 MG tablet Take 500 mg by mouth every 4 (four) hours as needed for mild pain. Take 1 tablet every 6 hours for 1 month (started 8/19)   Yes Historical Provider, MD   cefTRIAXone 1 g in dextrose 5 % 50 mL Inject 1 g into the vein daily. Mix with lidocaine 1%..  For 5 days   Yes Historical Provider, MD  Chloroxylenol-Zinc Oxide (BAZA EX) Apply 1 application topically daily as needed (to redness on buttocks).   Yes Historical Provider, MD  Cholecalciferol (VITAMIN D3) 2000 UNITS capsule Take 2,000 Units by mouth daily.   Yes Historical Provider, MD  divalproex (DEPAKOTE) 125 MG DR tablet Take 125 mg by mouth 2 (two) times daily.  09/05/14  Yes Historical Provider, MD  doxycycline (VIBRA-TABS) 100 MG tablet Take 100 mg by mouth 2 (two) times daily. For 14 days   Yes Historical Provider, MD  hydrochlorothiazide (MICROZIDE) 12.5 MG capsule Take 12.5 mg by mouth daily.   Yes Historical Provider, MD  levothyroxine (SYNTHROID, LEVOTHROID) 50 MCG tablet Take 50 mcg by mouth daily before breakfast.    Yes Historical Provider, MD  loperamide (IMODIUM A-D) 2 MG tablet Take 2-4 mg by mouth 4 (four) times daily as needed for diarrhea or loose stools (take  after first loose stool, then take  after each subsequent loose stool, no more than / 24 hrs).   Yes Historical Provider, MD  Melatonin 5 MG CAPS Take 1 capsule by mouth at bedtime.    Yes Historical Provider, MD  neomycin-bacitracin-polymyxin (NEOSPORIN) [phone] ointment Apply 1 application topically 2 (two) times a week. Cleanse lower forearm with NS, Apply ointment and cover with gauze, not applied by  nursing staff   Yes Historical Provider, MD  nystatin cream (MYCOSTATIN) Apply 1 application topically 4 (four) times daily as needed for dry skin (until healed).   Yes Historical Provider, MD  omega-3 acid ethyl esters (LOVAZA) 1 G capsule Take 1 g by mouth daily.   Yes Historical Provider, MD  Omega-3 Fatty Acids (FISH OIL) 1000 MG CAPS Take 1,000 mg by mouth daily.   Yes Historical Provider, MD  omeprazole (PRILOSEC) 20 MG capsule Take 20 mg by mouth daily before breakfast.    Yes Historical Provider, MD   risperiDONE (RISPERDAL) 0.25 MG tablet Take 0.25 mg by mouth at bedtime. And may take 1 tablet BID PRN for aggitation   Yes Historical Provider, MD  sennosides-docusate sodium (SENOKOT-S) 8.6-50 MG tablet Take 1 tablet by mouth 2 (two) times daily as needed for constipation.    Yes Historical Provider, MD  Skin Protectants, Misc. (EUCERIN) cream Apply 1 application topically every morning. Apply to legs   Yes Historical Provider, MD  sodium chloride irrigation 0.9 % irrigation Irrigate with 1 application as directed 3 (three) times a week. cleanse open areas  And pat dry, then wrap with conform gauze   Yes Historical Provider, MD  Sodium Phosphates (FLEET ENEMA RE) Place 1 application rectally daily as needed (if no BM in 3 days).   Yes Historical Provider, MD  Wound Dressings (CARRASYN HYDROGEL WOUND DRESS EX) Apply 1 application topically 3 (three) times a week. Apply to wound after cleansing three times per week, per Nursing home, not applied by staff   Yes Historical Provider, MD   BP 143/84 mmHg  Pulse 51  Temp(Src) 98 F (36.7 C) (Rectal)  Resp 16  SpO2 98% Physical Exam  Constitutional: She appears well-developed and well-nourished.  HENT:  Head: Normocephalic and atraumatic.  Eyes: Conjunctivae are normal. Right eye exhibits no discharge. Left eye exhibits no discharge.  Pulmonary/Chest: Effort normal. No respiratory distress.  Bilateral ronchi  Musculoskeletal: She exhibits edema.  Swelling of the left leg up to the left thigh with faint erythema and increased warmth  Neurological: Coordination normal.  Drowsy. Opens eyes to voice. Follows simple commands. Hard of hearing, otherwise CN seems intact. Globally weak. No focal motor deficit.   Skin: Skin is warm and dry. No rash noted. She is not diaphoretic. There is erythema.  Psychiatric: She has a normal mood and affect.  Nursing note and vitals reviewed.   ED Course  Procedures  DIAGNOSTIC STUDIES: Oxygen Saturation is 93%  on RA, low by my interpretation.    COORDINATION OF CARE: 11:21 PM - Discussed plans to order diagnostic studies and imaging. Pt advised of plan for treatment and pt agrees.  Labs Review Labs Reviewed  BASIC METABOLIC PANEL - Abnormal; Notable for the following:    Calcium 8.4 (*)    GFR calc non Af Amer 48 (*)    GFR calc Af Amer 56 (*)    All other components within normal limits  CBC WITH DIFFERENTIAL/PLATELET - Abnormal; Notable for the following:    Hemoglobin 11.7 (*)    RDW 15.7 (*)    All other components within normal limits  BRAIN NATRIURETIC PEPTIDE - Abnormal; Notable for the following:    B Natriuretic Peptide 156.7 (*)    All other components within normal limits  CULTURE, BLOOD (ROUTINE X 2)  CULTURE, BLOOD (ROUTINE X 2)  TROPONIN I  LACTIC ACID, PLASMA  LACTIC ACID, PLASMA    Imaging Review Dg Chest 2  View  08/22/2015  CLINICAL DATA:  82 year old female with cough and hypoxemia. EXAM: CHEST  2 VIEW COMPARISON:  Radiographs yesterday at 1256 hour FINDINGS: Patient range rotated. Left basilar opacity has increased with question small left pleural effusion. New right ill-defined basilar opacity. Cardiomediastinal contours are unchanged, patient remains rotated. There is increasing central pulmonary vascular prominence. Chronic left shoulder deformity. IMPRESSION: Increasing bibasilar opacities, left greater than right with probable left pleural effusion. Increasing central pulmonary vascular prominence. Findings may reflect CHF in the appropriate clinical setting. Alternatively, bibasilar atelectasis or aspiration could have this appearance. Electronically Signed   By: Rubye Oaks M.D.   On: 08/22/2015 23:56   Dg Chest 2 View  08/21/2015  CLINICAL DATA:  Cough today. The patient was diagnosed with pneumonia yesterday. Initial encounter. EXAM: CHEST  2 VIEW COMPARISON:  PA and lateral chest 12/09/2013. FINDINGS: The patient is status post right mastectomy and axillary  dissection. Lung volumes are lower than on the comparison study. There is left basilar airspace disease. The right lung appears clear. No pneumothorax or pleural effusion is identified. Heart size is upper normal. The patient is rotated on the study. IMPRESSION: Left basilar airspace disease which could be due to atelectasis in this low volume chest or pneumonia. Electronically Signed   By: Drusilla Kanner M.D.   On: 08/21/2015 13:08   I have personally reviewed and evaluated these images and lab results as part of my medical decision-making.   EKG Interpretation   Date/Time:  Friday August 23 2015 00:54:53 EST Ventricular Rate:  58 PR Interval:  159 QRS Duration: 96 QT Interval:  397 QTC Calculation: 390 R Axis:   -57 Text Interpretation:  Sinus rhythm Premature ventricular complexes Left  anterior fascicular block Low voltage, precordial leads Abnormal R-wave  progression, late transition Non-specific ST-t changes Confirmed by Juleen China   MD, Michala Deblanc ([id number]) on 08/23/2015 1:43:17 AM      MDM   Final diagnoses:  Heart failure, unspecified heart failure chronicity, unspecified heart failure type (HCC)  Hypoxemia  Positive blood culture    Positive culture. Gram + cocci in clusters. May be staff aureus. I think more likely contaminant. Afebrile. No leukocytosis. 1/2 cultures. Will repeat. Vitals fine. Abx deferred at this time. One recorded pressure of 82/63 which I suspect may be erroneous read because not consistent with all other recorded. Hypoxic and sounds wet. CXR consistent with developing pulmonary edema. O2 sats to 83% on RA when sleeping. Lasix. Also, LLE erythematous and swollen. Chronicity unclear. Not specifically mentioned on recent evaluation. Unna boots b/l LE. Consider DVT or cellulitis.    I personally preformed the services scribed in my presence. The recorded information has been reviewed is accurate. Linda Razor, MD.   Linda Razor, MD 08/23/15 [phone]

## 2015-08-22 NOTE — ED Notes (Signed)
Bed: RESA Expected date:  Expected time:  Means of arrival:  Comments: EMS 82 yo female seen yesterday for pneumonia/positive blood culture

## 2015-08-23 ENCOUNTER — Inpatient Hospital Stay (HOSPITAL_COMMUNITY): Payer: Medicare Other

## 2015-08-23 ENCOUNTER — Encounter (HOSPITAL_COMMUNITY): Payer: Self-pay | Admitting: *Deleted

## 2015-08-23 DIAGNOSIS — R4182 Altered mental status, unspecified: Secondary | ICD-10-CM | POA: Diagnosis present

## 2015-08-23 DIAGNOSIS — E876 Hypokalemia: Secondary | ICD-10-CM | POA: Diagnosis present

## 2015-08-23 DIAGNOSIS — I959 Hypotension, unspecified: Secondary | ICD-10-CM | POA: Diagnosis present

## 2015-08-23 DIAGNOSIS — Y95 Nosocomial condition: Secondary | ICD-10-CM | POA: Diagnosis present

## 2015-08-23 DIAGNOSIS — B957 Other staphylococcus as the cause of diseases classified elsewhere: Secondary | ICD-10-CM | POA: Diagnosis not present

## 2015-08-23 DIAGNOSIS — I824Z3 Acute embolism and thrombosis of unspecified deep veins of distal lower extremity, bilateral: Secondary | ICD-10-CM | POA: Diagnosis present

## 2015-08-23 DIAGNOSIS — Z885 Allergy status to narcotic agent status: Secondary | ICD-10-CM | POA: Diagnosis not present

## 2015-08-23 DIAGNOSIS — M1990 Unspecified osteoarthritis, unspecified site: Secondary | ICD-10-CM | POA: Diagnosis present

## 2015-08-23 DIAGNOSIS — G9340 Encephalopathy, unspecified: Secondary | ICD-10-CM | POA: Diagnosis present

## 2015-08-23 DIAGNOSIS — N179 Acute kidney failure, unspecified: Secondary | ICD-10-CM | POA: Diagnosis present

## 2015-08-23 DIAGNOSIS — I110 Hypertensive heart disease with heart failure: Secondary | ICD-10-CM | POA: Diagnosis present

## 2015-08-23 DIAGNOSIS — R7881 Bacteremia: Secondary | ICD-10-CM | POA: Diagnosis present

## 2015-08-23 DIAGNOSIS — I509 Heart failure, unspecified: Secondary | ICD-10-CM

## 2015-08-23 DIAGNOSIS — F0390 Unspecified dementia without behavioral disturbance: Secondary | ICD-10-CM | POA: Diagnosis present

## 2015-08-23 DIAGNOSIS — Z66 Do not resuscitate: Secondary | ICD-10-CM | POA: Diagnosis present

## 2015-08-23 DIAGNOSIS — M7989 Other specified soft tissue disorders: Secondary | ICD-10-CM | POA: Diagnosis not present

## 2015-08-23 DIAGNOSIS — Z882 Allergy status to sulfonamides status: Secondary | ICD-10-CM | POA: Diagnosis not present

## 2015-08-23 DIAGNOSIS — K219 Gastro-esophageal reflux disease without esophagitis: Secondary | ICD-10-CM | POA: Diagnosis present

## 2015-08-23 DIAGNOSIS — T501X5A Adverse effect of loop [high-ceiling] diuretics, initial encounter: Secondary | ICD-10-CM | POA: Diagnosis present

## 2015-08-23 DIAGNOSIS — I4891 Unspecified atrial fibrillation: Secondary | ICD-10-CM | POA: Diagnosis present

## 2015-08-23 DIAGNOSIS — I82412 Acute embolism and thrombosis of left femoral vein: Secondary | ICD-10-CM | POA: Diagnosis present

## 2015-08-23 DIAGNOSIS — J189 Pneumonia, unspecified organism: Principal | ICD-10-CM

## 2015-08-23 DIAGNOSIS — I10 Essential (primary) hypertension: Secondary | ICD-10-CM | POA: Diagnosis not present

## 2015-08-23 DIAGNOSIS — Z79899 Other long term (current) drug therapy: Secondary | ICD-10-CM | POA: Diagnosis not present

## 2015-08-23 DIAGNOSIS — E039 Hypothyroidism, unspecified: Secondary | ICD-10-CM | POA: Diagnosis present

## 2015-08-23 DIAGNOSIS — Z888 Allergy status to other drugs, medicaments and biological substances status: Secondary | ICD-10-CM | POA: Diagnosis not present

## 2015-08-23 DIAGNOSIS — I82409 Acute embolism and thrombosis of unspecified deep veins of unspecified lower extremity: Secondary | ICD-10-CM | POA: Diagnosis present

## 2015-08-23 DIAGNOSIS — B958 Unspecified staphylococcus as the cause of diseases classified elsewhere: Secondary | ICD-10-CM | POA: Diagnosis present

## 2015-08-23 DIAGNOSIS — I5032 Chronic diastolic (congestive) heart failure: Secondary | ICD-10-CM | POA: Diagnosis present

## 2015-08-23 DIAGNOSIS — R001 Bradycardia, unspecified: Secondary | ICD-10-CM | POA: Diagnosis present

## 2015-08-23 DIAGNOSIS — R05 Cough: Secondary | ICD-10-CM | POA: Diagnosis present

## 2015-08-23 LAB — URINALYSIS, ROUTINE W REFLEX MICROSCOPIC
Bilirubin Urine: NEGATIVE
Glucose, UA: NEGATIVE mg/dL
Hgb urine dipstick: NEGATIVE
Ketones, ur: NEGATIVE mg/dL
Leukocytes, UA: NEGATIVE
NITRITE: NEGATIVE
PROTEIN: NEGATIVE mg/dL
SPECIFIC GRAVITY, URINE: 1.007 (ref 1.005–1.030)
pH: 7 (ref 5.0–8.0)

## 2015-08-23 LAB — STREP PNEUMONIAE URINARY ANTIGEN: Strep Pneumo Urinary Antigen: NEGATIVE

## 2015-08-23 LAB — CBC WITH DIFFERENTIAL/PLATELET
Basophils Absolute: 0 10*3/uL (ref 0.0–0.1)
Basophils Relative: 0 %
Eosinophils Absolute: 0 10*3/uL (ref 0.0–0.7)
Eosinophils Relative: 0 %
HCT: 37 % (ref 36.0–46.0)
Hemoglobin: 11.7 g/dL — ABNORMAL LOW (ref 12.0–15.0)
Lymphocytes Relative: 39 %
Lymphs Abs: 2.6 10*3/uL (ref 0.7–4.0)
MCH: 28.5 pg (ref 26.0–34.0)
MCHC: 31.6 g/dL (ref 30.0–36.0)
MCV: 90 fL (ref 78.0–100.0)
Monocytes Absolute: 1 10*3/uL (ref 0.1–1.0)
Monocytes Relative: 15 %
Neutro Abs: 3 10*3/uL (ref 1.7–7.7)
Neutrophils Relative %: 46 %
Platelets: 244 10*3/uL (ref 150–400)
RBC: 4.11 MIL/uL (ref 3.87–5.11)
RDW: 15.7 % — ABNORMAL HIGH (ref 11.5–15.5)
WBC: 6.5 10*3/uL (ref 4.0–10.5)

## 2015-08-23 LAB — BASIC METABOLIC PANEL
Anion gap: 11 (ref 5–15)
BUN: 15 mg/dL (ref 6–20)
CO2: 28 mmol/L (ref 22–32)
Calcium: 8.4 mg/dL — ABNORMAL LOW (ref 8.9–10.3)
Chloride: 101 mmol/L (ref 101–111)
Creatinine, Ser: 0.95 mg/dL (ref 0.44–1.00)
GFR calc Af Amer: 56 mL/min — ABNORMAL LOW (ref >60)
GFR calc non Af Amer: 48 mL/min — ABNORMAL LOW (ref >60)
Glucose, Bld: 95 mg/dL (ref 65–99)
Potassium: 3.5 mmol/L (ref 3.5–5.1)
Sodium: 140 mmol/L (ref 135–145)

## 2015-08-23 LAB — PROTIME-INR
INR: 1.08 (ref 0.00–1.49)
PROTHROMBIN TIME: 13.7 s (ref 11.6–15.2)

## 2015-08-23 LAB — BRAIN NATRIURETIC PEPTIDE: B Natriuretic Peptide: 156.7 pg/mL — ABNORMAL HIGH (ref 0.0–100.0)

## 2015-08-23 LAB — TSH: TSH: 6.776 u[IU]/mL — AB (ref 0.350–4.500)

## 2015-08-23 LAB — TROPONIN I
Troponin I: 0.03 ng/mL (ref <0.031)
Troponin I: 0.03 ng/mL (ref <0.031)
Troponin I: 0.03 ng/mL (ref <0.031)
Troponin I: <0.03 ng/mL (ref <0.031)

## 2015-08-23 LAB — MRSA PCR SCREENING: MRSA BY PCR: POSITIVE — AB

## 2015-08-23 LAB — LACTIC ACID, PLASMA
Lactic Acid, Venous: 0.8 mmol/L (ref 0.5–2.0)
Lactic Acid, Venous: 1 mmol/L (ref 0.5–2.0)

## 2015-08-23 LAB — APTT: aPTT: 32 seconds (ref 24–37)

## 2015-08-23 MED ORDER — ACETAMINOPHEN 325 MG PO TABS: 650.0000 mg | ORAL_TABLET | ORAL | Status: DC | PRN

## 2015-08-23 MED ORDER — FUROSEMIDE 10 MG/ML IJ SOLN
60.0000 mg | Freq: Once | INTRAMUSCULAR | Status: AC
  Administered 2015-08-23: 60 mg via INTRAVENOUS
  Filled 2015-08-23: qty 8

## 2015-08-23 MED ORDER — ENOXAPARIN SODIUM 30 MG/0.3ML ~~LOC~~ SOLN
30.0000 mg | SUBCUTANEOUS | Status: AC
  Administered 2015-08-23: 30 mg via SUBCUTANEOUS
  Filled 2015-08-23: qty 0.3

## 2015-08-23 MED ORDER — SODIUM CHLORIDE 0.9 % IV SOLN: 250.0000 mL | INTRAVENOUS | Status: DC | PRN

## 2015-08-23 MED ORDER — PIPERACILLIN-TAZOBACTAM 3.375 G IVPB
3.3750 g | Freq: Three times a day (TID) | INTRAVENOUS | Status: DC
  Administered 2015-08-23 – 2015-08-24 (×2): 3.375 g via INTRAVENOUS
  Filled 2015-08-23 (×4): qty 50

## 2015-08-23 MED ORDER — ENOXAPARIN SODIUM 80 MG/0.8ML ~~LOC~~ SOLN
1.0000 mg/kg | SUBCUTANEOUS | Status: DC
  Administered 2015-08-24 – 2015-08-26 (×3): 65 mg via SUBCUTANEOUS
  Filled 2015-08-23 (×3): qty 0.8

## 2015-08-23 MED ORDER — HYDROCERIN EX CREA
1.0000 "application " | TOPICAL_CREAM | Freq: Every morning | CUTANEOUS | Status: DC
  Administered 2015-08-23 – 2015-08-26 (×4): 1 via TOPICAL
  Filled 2015-08-23: qty 113

## 2015-08-23 MED ORDER — ENOXAPARIN SODIUM 40 MG/0.4ML ~~LOC~~ SOLN
40.0000 mg | SUBCUTANEOUS | Status: DC
  Administered 2015-08-23: 40 mg via SUBCUTANEOUS
  Filled 2015-08-23: qty 0.4

## 2015-08-23 MED ORDER — ONDANSETRON HCL 4 MG/2ML IJ SOLN: 4.0000 mg | Freq: Four times a day (QID) | INTRAMUSCULAR | Status: DC | PRN

## 2015-08-23 MED ORDER — DEXTROSE 5 % IV SOLN
500.0000 mg | INTRAVENOUS | Status: DC
  Administered 2015-08-23: 500 mg via INTRAVENOUS
  Filled 2015-08-23: qty 500

## 2015-08-23 MED ORDER — CARRASYN HYDROGEL WOUND DRESS EX GEL: CUTANEOUS | Status: DC

## 2015-08-23 MED ORDER — SODIUM CHLORIDE 0.9% FLUSH
3.0000 mL | Freq: Two times a day (BID) | INTRAVENOUS | Status: DC
  Administered 2015-08-23 – 2015-08-25 (×6): 3 mL via INTRAVENOUS

## 2015-08-23 MED ORDER — VANCOMYCIN HCL IN DEXTROSE 1-5 GM/200ML-% IV SOLN
1000.0000 mg | INTRAVENOUS | Status: DC
  Administered 2015-08-23 – 2015-08-24 (×2): 1000 mg via INTRAVENOUS
  Filled 2015-08-23 (×3): qty 200

## 2015-08-23 MED ORDER — FLEET ENEMA 7-19 GM/118ML RE ENEM: 1.0000 | ENEMA | Freq: Every day | RECTAL | Status: DC | PRN

## 2015-08-23 MED ORDER — BACITRACIN-NEOMYCIN-POLYMYXIN 400-5-5000 EX OINT: 1.0000 "application " | TOPICAL_OINTMENT | CUTANEOUS | Status: DC

## 2015-08-23 MED ORDER — PANTOPRAZOLE SODIUM 40 MG IV SOLR
40.0000 mg | INTRAVENOUS | Status: DC
  Administered 2015-08-23 – 2015-08-25 (×3): 40 mg via INTRAVENOUS
  Filled 2015-08-23 (×5): qty 40

## 2015-08-23 MED ORDER — FUROSEMIDE 10 MG/ML IJ SOLN
20.0000 mg | Freq: Every day | INTRAMUSCULAR | Status: DC
  Administered 2015-08-23 – 2015-08-25 (×3): 20 mg via INTRAVENOUS
  Filled 2015-08-23 (×3): qty 2

## 2015-08-23 MED ORDER — NYSTATIN 100000 UNIT/GM EX CREA: 1.0000 "application " | TOPICAL_CREAM | Freq: Four times a day (QID) | CUTANEOUS | Status: DC | PRN

## 2015-08-23 MED ORDER — SODIUM CHLORIDE 0.9% FLUSH: 3.0000 mL | INTRAVENOUS | Status: DC | PRN

## 2015-08-23 MED ORDER — LEVOTHYROXINE SODIUM 100 MCG IV SOLR
25.0000 ug | Freq: Every day | INTRAVENOUS | Status: DC
  Administered 2015-08-23 – 2015-08-24 (×2): 25 ug via INTRAVENOUS
  Filled 2015-08-23 (×2): qty 5

## 2015-08-23 MED ORDER — ASPIRIN 300 MG RE SUPP
300.0000 mg | Freq: Every day | RECTAL | Status: DC
  Administered 2015-08-23 – 2015-08-26 (×4): 300 mg via RECTAL
  Filled 2015-08-23 (×4): qty 1

## 2015-08-23 MED ORDER — DEXTROSE 5 % IV SOLN
1.0000 g | INTRAVENOUS | Status: DC
  Administered 2015-08-23: 1 g via INTRAVENOUS
  Filled 2015-08-23: qty 10

## 2015-08-23 NOTE — Progress Notes (Signed)
PT Cancellation Note / Screen  Patient Details Name: Linda Mccullough MRN: [id number] DOB: 18-Aug-1914   Cancelled Treatment:    Reason Eval/Treat Not Completed: PT screened, no needs identified, will sign off Pt resident of memory care unit at facility, hx of dementia.  Pt reports she is from [facility] and had trouble answering questions regarding baseline activity level however reports using bed pan.  NT reports pt is total assist for bed mobility at this time.  Pt appears bed to w/c level at baseline so will defer needs back to facility.  Palliative consult also pending.  Please re-order if pt would benefit from PT.   Kirbie Stodghill,KATHrine E 08/23/2015, 3:58 PM Zenovia Jarred, PT, DPT 08/23/2015 Pager: [phone]

## 2015-08-23 NOTE — Progress Notes (Signed)
Pharmacy Antibiotic Note  Linda Mccullough is a 82 y.o. female admitted on 08/22/2015 with pneumonia and bacteremia.   Patient admitted from NH with +DVT, HCAP vs aspiration PNA.  Blood cx from NH now +1/2 GPCC and ECHO + possible mural thrombus.  Antibiotics are being broadened to cover for possible endocarditis + HCAP.  Pharmacy has been consulted for Vancomycin & Zosyn dosing.  Plan: Vancomycin  IV every 24 hours.  Goal trough 15-20 mcg/mL. Zosyn 3.375g IV q8h (4 hour infusion).  Check Vancomycin trough at steady state Monitor renal function and cx data   Height:  (170.2 cm) Weight: 148 lb (67.132 kg) IBW/kg (Calculated) : 61.6  Temp (24hrs), Avg:97.9 F (36.6 C), Min:97.4 F (36.3 C), Max:98.3 F (36.8 C)   Recent Labs Lab 08/21/15 1217 08/22/15 2340 08/23/15 0239  WBC 5.7 6.5  --   CREATININE 1.03* 0.95  --   LATICACIDVEN  --  1.0 0.8    Estimated Creatinine Clearance: 32.2 mL/min (by C-G formula based on Cr of 0.95).    Allergies  Allergen Reactions  . Aricept [Donepezil Hcl] Other (See Comments)    Reaction Unknown  . Codeine Other (See Comments)    Reaction Unknown  . Hydrocodone Other (See Comments)    Reaction Unknown  . Meloxicam Other (See Comments)    Reaction Unknown  . Sulfa Antibiotics Other (See Comments)    Reaction Unknown    Antimicrobials this admission: 2/22 CTX>>2/24 (started PTA) 2/24 Azithro>>2/24 2/24>>Zosyn>> 2/24>>Vanc>> 2/22>>Doxycycline>>2/24 (at [facility])  Dose adjustments this admission:  Microbiology results: 2/24 BCx: IP 2/24 MRSA PCR: POSITIVE *Blood cx from NH reported as 1/2 [facility]  Thank you for allowing pharmacy to be a part of this patient's care.  Elson Clan 08/23/2015 7:55 PM

## 2015-08-23 NOTE — Consult Note (Signed)
WOC wound consult note Reason for Consult: Patient wears Unnas boots at home.  Asked to consult regarding reapplication.  Were removed for doppler studies bilateral lower legs. (+) for bilateral age indeterminate DVT Wound type:Chronic venous stasis Pressure Ulcer POA: N/A Measurement:Skin is intact.  Chronic skin changes noted to bilateral lower legs and generalized edema Wound ZOX:WRUE Drainage (amount, consistency, odor) None Periwound:Dry skin, chronic skin changes Dressing procedure/placement/frequency:Will not replace Unnas boots at this this time due to the presence of DVT in bilateral lower legs.  Will keep legs elevated.  Will not follow at this time.  Please re-consult if needed.  Maple Hudson RN BSN CWON Pager [phone]

## 2015-08-23 NOTE — Progress Notes (Signed)
  Echocardiogram 2D Echocardiogram has been performed.  Tye Savoy 08/23/2015, 3:46 PM

## 2015-08-23 NOTE — Care Management Note (Signed)
Case Management Note  Patient Details  Name: Charnette Younkin MRN: [id number] Date of Birth: December 05, 1914  Subjective/Objective:    82 y/o f admitted w/Acute encephalopathy. XB:JYNWGNFA,OZH. Noted-Nsg adm summary-From Memory care unit-[facility].Palliative cons.CSW following.                Action/Plan:d/c SNF.   Expected Discharge Date:                  Expected Discharge Plan:  Skilled Nursing Facility  In-House Referral:  Clinical Social Work  Discharge planning Services  CM Consult  Post Acute Care Choice:    Choice offered to:     DME Arranged:    DME Agency:     HH Arranged:    HH Agency:     Status of Service:  In process, will continue to follow  Medicare Important Message Given:    Date Medicare IM Given:    Medicare IM give by:    Date Additional Medicare IM Given:    Additional Medicare Important Message give by:     If discussed at Long Length of Stay Meetings, dates discussed:    Additional Comments:  Lanier Clam, RN 08/23/2015, 12:33 PM

## 2015-08-23 NOTE — Progress Notes (Addendum)
ANTICOAGULATION CONSULT NOTE - Initial Consult  Pharmacy Consult for Lovenox Indication: DVT  Allergies  Allergen Reactions  . Aricept [Donepezil Hcl] Other (See Comments)    Reaction Unknown  . Codeine Other (See Comments)    Reaction Unknown  . Hydrocodone Other (See Comments)    Reaction Unknown  . Meloxicam Other (See Comments)    Reaction Unknown  . Sulfa Antibiotics Other (See Comments)    Reaction Unknown    Patient Measurements: Height:  (170.2 cm) Weight: 148 lb (67.132 kg) IBW/kg (Calculated) : 61.6 Heparin Dosing Weight:   Vital Signs: Temp: 97.4 F (36.3 C) (02/24 0910) Temp Source: Oral (02/24 0910) BP: 138/62 mmHg (02/24 0910) Pulse Rate: 104 (02/24 0910)  Labs:  Recent Labs  08/21/15 1217 08/22/15 2340 08/23/15 0421 08/23/15 0959  HGB 11.8* 11.7*  --   --   HCT 36.6 37.0  --   --   PLT 199 244  --   --   APTT  --   --  32  --   LABPROT  --   --  13.7  --   INR  --   --  1.08  --   CREATININE 1.03* 0.95  --   --   TROPONINI  --  0.03 0.03 0.03    Estimated Creatinine Clearance: 32.2 mL/min (by C-G formula based on Cr of 0.95).   Medical History: Past Medical History  Diagnosis Date  . Dementia   . Hypothyroid   . Arthritis   . Hypertension   . GERD (gastroesophageal reflux disease)   . Diverticulitis     Assessment: 75 yoF with PMHx dementia, diverticulitis, hypothyroidism, and HTN sent to [facility] from SNF on 2/22 for cough and CXR showed questionable PNA but normal WBC and afebrile, discharged back to facility. Pt returned 2/23 with AMS and positive blood cultures 1 of 2 GPC in clusters, also noted to have bilateral leg edema.  Pt started on empiric antibiotics for PNA and now dopplers show bilateral DVTs (age indeterminate).  Pharmacy consulted to start lovenox for VTE treatment.   Pt with SCr 0.95, CrCl ~ 32 ml/min.  Renal function is borderline for lovenox q12h vs q24h.  Due to advanced age, will use more conservative dosing.   Discussed with MD who is ok with this.  No acute issues noted with CBC.   Note pt received LMWH  SQ at 1130 this AM.    Goal of Therapy:  Anti-Xa level 0.6-1 units/ml 4hrs after LMWH dose given Monitor platelets by anticoagulation protocol: Yes   Plan:  Additional Lovenox  SQ x1 STAT to provide total dose Lovenox  today.   Start Lovenox  ( /kg) SQ q24h tomorrow at noon.  F/u CBC, renal fxn, signs/symptoms of bleeding.   Haynes Hoehn, PharmD, BCPS 08/23/2015, 1:33 PM  Pager: [phone]

## 2015-08-23 NOTE — ED Notes (Addendum)
BP cuff malpositioned. Corrected. See BP at 2356

## 2015-08-23 NOTE — Progress Notes (Addendum)
Pt seen and examined at bedside, please see earlier admission note by Dr. Clyde Lundborg. Pt admitted with lethargy, noted to have bilateral LE DVT. Started Lovenox per pharmacy. Son update over the phone, very clear she is DNR, no interventions at all except medications that are already given, he was very clear no cardiologic evaluation and would prefer we take her off telemetry by AM. He has agreed with palliative care team input and is very appreciative of our efforts. I will change ABX from Zithromax and rocephin to Vanc and zosyn as ECHO with ? Mural thrombus and one set of blood cultures from 2/22 is with g+ cocci in clusters. Son is clear no TEE or other interventions but at least we can treat with ABX and readjust the regimen once final blood cultures are back.  Debbora Presto, MD  [facility] Pager [phone]  If 7PM-7AM, please contact night-coverage [email or web address] Password [email or web address]

## 2015-08-23 NOTE — Progress Notes (Signed)
PHARMACY NOTE   Pharmacy has been assisting with renal dosing of Ceftriaxone and Azithromycin for PNA. Dosage remains stable  and need for further dosage adjustment appears unlikely at present.    Will sign off at this time.  Please reconsult if a change in clinical status warrants re-evaluation of dosage.   Haynes Hoehn, PharmD, BCPS 08/23/2015, 9:33 AM  Pager: [phone]

## 2015-08-23 NOTE — ED Notes (Signed)
Pt placed on 2L. O2 dropped to 83% when she was asleep.

## 2015-08-23 NOTE — Progress Notes (Signed)
VASCULAR LAB PRELIMINARY  PRELIMINARY  PRELIMINARY  PRELIMINARY  Bilateral lower extremity venous duplex  completed.    Preliminary report:  Right:  Age indeterminate DVT noted in the popliteal vein.  Some reconstitution noted.  No evidence of superficial thrombosis.  No Baker's cyst.  Left: Age indeterminate DVT noted in the DFV, FV, popliteal v.  Some reconstitution noted.  No evidence of superficial thrombosis.  No Baker's cyst.  Deltha Bernales, RVT 08/23/2015, 12:44 PM

## 2015-08-23 NOTE — Progress Notes (Signed)
OT Cancellation Note  Patient Details Name: Linda Mccullough MRN: [id number] DOB: 1914-11-04   Cancelled Treatment:    Reason Eval/Treat Not Completed: OT screened, no needs identified, will sign off.  Spoke with med tech from [facility] who is familiar with pt.  Pt required total A for bed <>w/c transfers and total A for all ADLs.  She has had a progressive decline in function.  Pt not appropriate for acute OT services, will sign off.   Angelene Giovanni Savage, OTR/L [phone]  08/23/2015, 4:25 PM

## 2015-08-23 NOTE — ED Notes (Signed)
EKG given to EDP,Nanavati,MD., for review. 

## 2015-08-23 NOTE — H&P (Signed)
[facility] History and Physical  [name] XBJ:[id number] DOB: [birth year] DOA: 08/22/2015  Referring physician: ED physician PCP: Deforest Hoyles, MD  Specialists:   Chief Complaint: Altered mental status and positive blood culture 1/2  HPI: Linda Mccullough is a 82 y.o. female with PMH of hypertension, dementia, diverticulitis, arthritis, GERD, hypothyroidism, who presents with altered mental status and positive blood culture 1/2.  Pt was seen in ED yesterday. Per dr. Corky Sing notE, her presentation is not consistent with pneumonia, but patient was somehow started with Rocephin and doxycycline for possible pneumonia in nursing home. She had blood culture drawn, which comes back positive 1/2 doday, showing gram-positive cocci in cluster in anaerobic bottle only. When I saw patient in the emergency room, she is lethargic, answered some questions, moving all extremities, denies any pain. She has cough, but denies chest pain or shortness of breath. She has bilateral leg edema. The history is very limited.  In ED, patient was found to have hypotension 82/64 which improved to 113/55 later without IV fluid, lactate of 1.0, troponin negative, BNP 56.7, WBC 6.5, temperature 98, bradycardia, negative troponin, electrolytes and renal function okay. Chest x-ray showed bilateral basilar infiltration and vascular congestion consistent with CHF. Patient is admitted to inpatient for further eval and treatment.   EKG: QTC 390, bradycardia, T-wave flattening, low voltage.  Where does patient live?  SNF Can patient participate in ADLs?  None   Review of Systems: Could not be reviewed accurately due to dementia and altered mental status.   Allergy:  Allergies  Allergen Reactions  . Aricept [Donepezil Hcl] Other (See Comments)    Reaction Unknown  . Codeine Other (See Comments)    Reaction Unknown  . Hydrocodone Other (See Comments)    Reaction Unknown  . Meloxicam Other (See Comments)    Reaction  Unknown  . Sulfa Antibiotics Other (See Comments)    Reaction Unknown    Past Medical History  Diagnosis Date  . Dementia   . Hypothyroid   . Arthritis   . Hypertension   . GERD (gastroesophageal reflux disease)   . Diverticulitis     No past surgical history on file.  Social History:  reports that she has never smoked. She does not have any smokeless tobacco history on file. She reports that she does not drink alcohol or use illicit drugs.  Family History: Could not be reviewed accurately due to dementia and altered mental status.   Prior to Admission medications   Medication Sig Start Date End Date Taking? Authorizing Provider  acetaminophen (TYLENOL) 500 MG tablet Take 500 mg by mouth every 4 (four) hours as needed for mild pain. Take 1 tablet every 6 hours for 1 month (started 8/19)   Yes Historical Provider, MD  cefTRIAXone 1 g in dextrose 5 % 50 mL Inject 1 g into the vein daily. Mix with lidocaine 1%..  For 5 days   Yes Historical Provider, MD  Chloroxylenol-Zinc Oxide (BAZA EX) Apply 1 application topically daily as needed (to redness on buttocks).   Yes Historical Provider, MD  Cholecalciferol (VITAMIN D3) 2000 UNITS capsule Take 2,000 Units by mouth daily.   Yes Historical Provider, MD  divalproex (DEPAKOTE) 125 MG DR tablet Take 125 mg by mouth 2 (two) times daily.  09/05/14  Yes Historical Provider, MD  doxycycline (VIBRA-TABS) 100 MG tablet Take 100 mg by mouth 2 (two) times daily. For 14 days   Yes Historical Provider, MD  hydrochlorothiazide (MICROZIDE) 12.5 MG capsule Take 12.5 mg by  mouth daily.   Yes Historical Provider, MD  levothyroxine (SYNTHROID, LEVOTHROID) 50 MCG tablet Take 50 mcg by mouth daily before breakfast.    Yes Historical Provider, MD  loperamide (IMODIUM A-D) 2 MG tablet Take 2-4 mg by mouth 4 (four) times daily as needed for diarrhea or loose stools (take  after first loose stool, then take  after each subsequent loose stool, no more than / 24  hrs).   Yes Historical Provider, MD  Melatonin 5 MG CAPS Take 1 capsule by mouth at bedtime.    Yes Historical Provider, MD  neomycin-bacitracin-polymyxin (NEOSPORIN) [phone] ointment Apply 1 application topically 2 (two) times a week. Cleanse lower forearm with NS, Apply ointment and cover with gauze, not applied by nursing staff   Yes Historical Provider, MD  nystatin cream (MYCOSTATIN) Apply 1 application topically 4 (four) times daily as needed for dry skin (until healed).   Yes Historical Provider, MD  omega-3 acid ethyl esters (LOVAZA) 1 G capsule Take 1 g by mouth daily.   Yes Historical Provider, MD  Omega-3 Fatty Acids (FISH OIL) 1000 MG CAPS Take 1,000 mg by mouth daily.   Yes Historical Provider, MD  omeprazole (PRILOSEC) 20 MG capsule Take 20 mg by mouth daily before breakfast.    Yes Historical Provider, MD  risperiDONE (RISPERDAL) 0.25 MG tablet Take 0.25 mg by mouth at bedtime. And may take 1 tablet BID PRN for aggitation   Yes Historical Provider, MD  sennosides-docusate sodium (SENOKOT-S) 8.6-50 MG tablet Take 1 tablet by mouth 2 (two) times daily as needed for constipation.    Yes Historical Provider, MD  Skin Protectants, Misc. (EUCERIN) cream Apply 1 application topically every morning. Apply to legs   Yes Historical Provider, MD  sodium chloride irrigation 0.9 % irrigation Irrigate with 1 application as directed 3 (three) times a week. cleanse open areas  And pat dry, then wrap with conform gauze   Yes Historical Provider, MD  Sodium Phosphates (FLEET ENEMA RE) Place 1 application rectally daily as needed (if no BM in 3 days).   Yes Historical Provider, MD  Wound Dressings (CARRASYN HYDROGEL WOUND DRESS EX) Apply 1 application topically 3 (three) times a week. Apply to wound after cleansing three times per week, per Nursing home, not applied by staff   Yes Historical Provider, MD    Physical Exam: Filed Vitals:   08/23/15 0600 08/23/15 0636 08/23/15 0700 08/23/15 0730  BP:  116/62 130/75 115/66 116/51  Pulse: 52 103 44 59  Temp:      TempSrc:      Resp: SpO2: 98% 97% 98% 99%   General: Not in acute distress HEENT:       Eyes: PERRL, EOMI, no scleral icterus.       ENT: No discharge from the ears and nose, no pharynx injection, no tonsillar enlargement.        Neck: positive JVD, no bruit, no mass felt. Heme: No neck lymph node enlargement. Cardiac: S1/S2, RRR, Bradycardia, No murmurs, No gallops or rubs. Pulm: No rales, wheezing, rhonchi or rubs. Abd: Soft, nondistended, nontender, no rebound pain, no organomegaly, BS present. Ext: 3+ pitting leg edema bilaterally. 2+DP/PT pulse bilaterally. Musculoskeletal: No joint deformities, No joint redness or warmth, no limitation of ROM in spin. Skin: No rashes. Has bruise over left arm. Neuro: not oriented X3, cranial nerves II-XII grossly intact, moves all extremities. Psych: Patient is not psychotic, no suicidal or hemocidal ideation.  Labs on Admission:  Basic Metabolic Panel:  Recent Labs Lab 08/21/15 1217 08/22/15 2340  NA 136 140  K 3.6 3.5  CL 98* 101  CO2 27 28  GLUCOSE 111* 95  BUN 15 15  CREATININE 1.03* 0.95  CALCIUM 8.1* 8.4*   Liver Function Tests: No results for input(s): AST, ALT, ALKPHOS, BILITOT, PROT, ALBUMIN in the last 168 hours. No results for input(s): LIPASE, AMYLASE in the last 168 hours. No results for input(s): AMMONIA in the last 168 hours. CBC:  Recent Labs Lab 08/21/15 1217 08/22/15 2340  WBC 5.7 6.5  NEUTROABS 3.6 3.0  HGB 11.8* 11.7*  HCT 36.6 37.0  MCV 88.2 90.0  PLT 199 244   Cardiac Enzymes:  Recent Labs Lab 08/22/15 2340 08/23/15 0421  TROPONINI 0.03 0.03    BNP (last 3 results)  Recent Labs  08/22/15 2340  BNP 156.7*    ProBNP (last 3 results) No results for input(s): PROBNP in the last 8760 hours.  CBG: No results for input(s): GLUCAP in the last 168 hours.  Radiological Exams on Admission: Dg Chest 2  View  08/22/2015  CLINICAL DATA:  82 year old female with cough and hypoxemia. EXAM: CHEST  2 VIEW COMPARISON:  Radiographs yesterday at 1256 hour FINDINGS: Patient range rotated. Left basilar opacity has increased with question small left pleural effusion. New right ill-defined basilar opacity. Cardiomediastinal contours are unchanged, patient remains rotated. There is increasing central pulmonary vascular prominence. Chronic left shoulder deformity. IMPRESSION: Increasing bibasilar opacities, left greater than right with probable left pleural effusion. Increasing central pulmonary vascular prominence. Findings may reflect CHF in the appropriate clinical setting. Alternatively, bibasilar atelectasis or aspiration could have this appearance. Electronically Signed   By: Rubye Oaks M.D.   On: 08/22/2015 23:56   Dg Chest 2 View  08/21/2015  CLINICAL DATA:  Cough today. The patient was diagnosed with pneumonia yesterday. Initial encounter. EXAM: CHEST  2 VIEW COMPARISON:  PA and lateral chest 12/09/2013. FINDINGS: The patient is status post right mastectomy and axillary dissection. Lung volumes are lower than on the comparison study. There is left basilar airspace disease. The right lung appears clear. No pneumothorax or pleural effusion is identified. Heart size is upper normal. The patient is rotated on the study. IMPRESSION: Left basilar airspace disease which could be due to atelectasis in this low volume chest or pneumonia. Electronically Signed   By: Drusilla Kanner M.D.   On: 08/21/2015 13:08    Assessment/Plan Principal Problem:   Acute encephalopathy Active Problems:   Hypertension   Hypothyroid   Dementia   CHF (congestive heart failure) (HCC)   GERD (gastroesophageal reflux disease)   HCAP (healthcare-associated pneumonia)    Acute encephalopathy: Etiology is not clear. Chest x-ray showed bilateral basilar infiltration, indicating possible pneumonia (HCAP). Since patient does not  have fever, leukocytosis, this diagnosis is questionable. Given patient hypotension, oxygen desaturating to 83% at one time, I will continue her antibiotic treatment which were started in the nursing home. I will continue Rocephin and switch doxycycline to azithromycin. Patient does not meet criteria for sepsis. Currently hemodynamically stable. Lactate level I.0. - Will admit to Telemetry Bed - IV Rocephin and azithromycin - Urine legionella and S. pneumococcal antigen - Follow up blood culture x2, sputum culture - check UA - hold oral meds now  Possible CHF: Patient does not have history of congestive heart failure, but she has mildly elevated BNP, 3+ bilateral leg edema, and positive JVD, indicating possible CHF. 60 mg of Lasix was given  in the emergency room. -Lasix 20 mg daily by IV -trop x 3 -2d echo -start ASA per rectal -Daily weights -strict I/O's -Low salt diet -No BB due to acute decompensation of CHF -LE doppler to r/o DVT  HTN -switch HCTZ to lasix   Hypothyroidism: Last TSH was 2.5-8 on 05/07/13  -Continue home Synthroid, but switch to IV, but dose by half (50 --> 25 mcg daily)  GERD: -Protonix IV  DVT ppx: SQ Lovenox  Code Status: DNR Family Communication: None at bed side. Disposition Plan: Admit to inpatient   Date of Service 08/23/2015    Lorretta Harp [facility] Pager [phone]  If 7PM-7AM, please contact night-coverage [email or web address] Password [facility] 08/23/2015, 7:48 AM

## 2015-08-24 DIAGNOSIS — B957 Other staphylococcus as the cause of diseases classified elsewhere: Secondary | ICD-10-CM

## 2015-08-24 DIAGNOSIS — I10 Essential (primary) hypertension: Secondary | ICD-10-CM

## 2015-08-24 DIAGNOSIS — G9340 Encephalopathy, unspecified: Secondary | ICD-10-CM

## 2015-08-24 DIAGNOSIS — F0390 Unspecified dementia without behavioral disturbance: Secondary | ICD-10-CM

## 2015-08-24 LAB — CULTURE, BLOOD (ROUTINE X 2)

## 2015-08-24 LAB — BASIC METABOLIC PANEL
ANION GAP: 11 (ref 5–15)
BUN: 11 mg/dL (ref 6–20)
CALCIUM: 7.9 mg/dL — AB (ref 8.9–10.3)
CHLORIDE: 98 mmol/L — AB (ref 101–111)
CO2: 30 mmol/L (ref 22–32)
Creatinine, Ser: 0.91 mg/dL (ref 0.44–1.00)
GFR calc non Af Amer: 51 mL/min — ABNORMAL LOW (ref >60)
GFR, EST AFRICAN AMERICAN: 59 mL/min — AB (ref >60)
Glucose, Bld: 84 mg/dL (ref 65–99)
Potassium: 3 mmol/L — ABNORMAL LOW (ref 3.5–5.1)
Sodium: 139 mmol/L (ref 135–145)

## 2015-08-24 LAB — CBC
HCT: 36.2 % (ref 36.0–46.0)
HEMOGLOBIN: 11.6 g/dL — AB (ref 12.0–15.0)
MCH: 28.6 pg (ref 26.0–34.0)
MCHC: 32 g/dL (ref 30.0–36.0)
MCV: 89.2 fL (ref 78.0–100.0)
Platelets: 237 10*3/uL (ref 150–400)
RBC: 4.06 MIL/uL (ref 3.87–5.11)
RDW: 15.1 % (ref 11.5–15.5)
WBC: 4.7 10*3/uL (ref 4.0–10.5)

## 2015-08-24 MED ORDER — POTASSIUM CHLORIDE 10 MEQ/100ML IV SOLN
10.0000 meq | INTRAVENOUS | Status: DC
  Filled 2015-08-24: qty 100

## 2015-08-24 NOTE — Progress Notes (Addendum)
Patient ID: Linda Mccullough, female   DOB: 09-28-1914, 82 y.o.   MRN: [id number] [facility] PROGRESS NOTE  Linda Mccullough WUJ:[id number] DOB: October 25, 1914 DOA: 08/22/2015 PCP: Linda Hoyles, MD  Brief narrative:    82 y.o. female with past medical history of hypertension, dementia, diverticulitis, arthritis, GERD, hypothyroidism who presented to [facility] ED with altered mental status and positive blood culture (GPC in clusters in one of the bottles).  In ED, patient was found to be hypotensive with BP as low as 82/64 which improved to 113/55 with hydration. Lactic acid was WNL, troponin was WNL, BNPwas 56.7, WBC 6.5. Chest x-ray showed bilateral basilar infiltration and vascular congestion consistent with CHF.  While awaiting final blood cultures she was started empirically on vanco and zosyn.   Assessment/Plan:    Principal Problem: Acute encephalopathy / Dementia without behavioral disturbance  - Stable - PT eval and SLP in progress - Appreciate PCT consult and recommendations  Active Problems: DVT, lower extremity (HCC) - On full dose AC with Lovenox  Gram positive cocci, coagulase negative staphylococcus infection  - One of the BCx on 2/22 with coag neg species, contaminant - D/C zosyn - Repeat blood cultures show no growth x 1 day - Would wait additional day or so before we stop vanco just to make sure blood culture result does not change  Hypertension, essential  - Continue lasix 20 mg IV daily  Hypothyroid - Continue synthroid - TSH slightly above range, 6.7 - Needs outpt follow up in 1 month and then adjust synthroid if still outside the normal range  Chronic diastolic CHF (congestive heart failure) (HCC) - Compensated - Continue lasix - Continue aspirin  Hypokalemia - Due to lasix - Supplemented - Check BMP in am  Atrial fibrillation with RVR (HCC) - CHADS 3-4 - On AC with Lovenox - She is not on BB. Her BP is 135/61 and HR 93 so will defer CCB or BB for now    DVT Prophylaxis  - Lovenox subQ   Code Status: DNR/DNI Family Communication:  Family not at the bedside this am  Disposition Plan: to SNF likely by 08/26/2015  IV access:  Peripheral IV  Procedures and diagnostic studies:    Dg Chest 2 View 08/22/2015 Increasing bibasilar opacities, left greater than right with probable left pleural effusion. Increasing central pulmonary vascular prominence. Findings may reflect CHF in the appropriate clinical setting. Alternatively, bibasilar atelectasis or aspiration could have this appearance.   Dg Chest 2 View 08/21/2015  Left basilar airspace disease which could be due to atelectasis in this low volume chest or pneumonia.   Medical Consultants:  PCT  Other Consultants:  PT SLP  IAnti-Infectives:   Vanco --> Zosyn 08/22/2015 --> 08/24/2015   Linda Passey, MD  [facility] Pager [phone]  Time spent in minutes: 25 minutes  If 7PM-7AM, please contact night-coverage [email or web address] Password [email or web address] 08/24/2015, 1:10 PM   LOS: 1 day    HPI/Subjective: No acute overnight events. Patient not agitated, stable.   Objective: Filed Vitals:   08/23/15 0910 08/23/15 1431 08/23/15 2134 08/24/15 0502  BP: 138/62 154/74 150/65 135/61  Pulse: 104 51 65 93  Temp: 97.4 F (36.3 C) 98.3 F (36.8 C) 98.2 F (36.8 C) 98.1 F (36.7 C)  TempSrc: Oral Oral Oral Oral  Resp: 16 18 18    Height: 5\' 7"  (1.702 m)     Weight: 67.132 kg (148 lb)   68.221 kg (150 lb 6.4 oz)  SpO2: 98% 98% 98% 98%  Intake/Output Summary (Last 24 hours) at 08/24/15 1310 Last data filed at 08/24/15 0533  Gross per 24 hour  Intake    600 ml  Output      0 ml  Net    600 ml    Exam:   General:  Pt is  not in acute distress  Cardiovascular: Regular rate and rhythm, S1/S2 (+)  Respiratory: Clear to auscultation bilaterally, no wheezing, no crackles, no rhonchi  Abdomen: Soft, non tender, non distended, bowel sounds present  Extremities: No edema, pulses  palpable bilaterally  Neuro: Grossly nonfocal  Data Reviewed: Basic Metabolic Panel:  Recent Labs Lab 08/21/15 1217 08/22/15 2340 08/24/15 0534  NA 136 140 139  K 3.6 3.5 3.0*  CL 98* 101 98*  CO2 GLUCOSE 111* 95 84  BUN CREATININE 1.03* 0.95 0.91  CALCIUM 8.1* 8.4* 7.9*   Liver Function Tests: No results for input(s): AST, ALT, ALKPHOS, BILITOT, PROT, ALBUMIN in the last 168 hours. No results for input(s): LIPASE, AMYLASE in the last 168 hours. No results for input(s): AMMONIA in the last 168 hours. CBC:  Recent Labs Lab 08/21/15 1217 08/22/15 2340 08/24/15 0534  WBC 5.7 6.5 4.7  NEUTROABS 3.6 3.0  --   HGB 11.8* 11.7* 11.6*  HCT 36.6 37.0 36.2  MCV 88.2 90.0 89.2  PLT 199 244 237   Cardiac Enzymes:  Recent Labs Lab 08/22/15 2340 08/23/15 0421 08/23/15 0959 08/23/15 1645  TROPONINI 0.03 0.03 0.03 <0.03   BNP: Invalid input(s): POCBNP CBG: No results for input(s): GLUCAP in the last 168 hours.  Recent Results (from the past 240 hour(s))  Culture, blood (Routine X 2) w Reflex to ID Panel     Status: None   Collection Time: 08/21/15 12:15 PM  Result Value Ref Range Status   Specimen Description BLOOD LEFT ANTECUBITAL  Final   Special Requests BOTTLES DRAWN AEROBIC AND ANAEROBIC 5 CC  Final   Culture  Setup Time   Final    GRAM POSITIVE COCCI IN CLUSTERS AEROBIC BOTTLE ONLY CRITICAL RESULT CALLED TO, READ BACK BY AND VERIFIED WITH: K ROBERTSON RN 2050 08/22/15 A BROWNING    Culture   Final    STAPHYLOCOCCUS SPECIES (COAGULASE NEGATIVE) THE SIGNIFICANCE OF ISOLATING THIS ORGANISM FROM A SINGLE SET OF BLOOD CULTURES WHEN MULTIPLE SETS ARE DRAWN IS UNCERTAIN. PLEASE NOTIFY THE MICROBIOLOGY DEPARTMENT WITHIN ONE WEEK IF SPECIATION AND SENSITIVITIES ARE REQUIRED. Performed at [facility]    Report Status 08/24/2015 FINAL  Final  Culture, blood (Routine X 2) w Reflex to ID Panel     Status: None (Preliminary result)    Collection Time: 08/21/15 12:17 PM  Result Value Ref Range Status   Specimen Description BLOOD RIGHT ANTECUBITAL  Final   Special Requests BOTTLES DRAWN AEROBIC AND ANAEROBIC 5 CC  Final   Culture   Final    NO GROWTH 3 DAYS Performed at [facility]    Report Status PENDING  Incomplete  Blood culture (routine x 2)     Status: None (Preliminary result)   Collection Time: 08/22/15 11:40 PM  Result Value Ref Range Status   Specimen Description BLOOD LEFT ANTECUBITAL  Final   Special Requests BOTTLES DRAWN AEROBIC AND ANAEROBIC 5CC  Final   Culture   Final    NO GROWTH 1 DAY Performed at [facility]    Report Status PENDING  Incomplete  Blood culture (routine x 2)  Status: None (Preliminary result)   Collection Time: 08/23/15  2:47 AM  Result Value Ref Range Status   Specimen Description BLOOD LEFT FOREARM  Final   Special Requests IN PEDIATRIC BOTTLE  Final   Culture   Final    NO GROWTH 1 DAY Performed at [facility]    Report Status PENDING  Incomplete  MRSA PCR Screening     Status: Abnormal   Collection Time: 08/23/15  9:36 AM  Result Value Ref Range Status   MRSA by PCR POSITIVE (A) NEGATIVE Final    Comment:        The GeneXpert MRSA Assay (FDA approved for NASAL specimens only), is one component of a comprehensive MRSA colonization surveillance program. It is not intended to diagnose MRSA infection nor to guide or monitor treatment for MRSA infections. RESULT CALLED TO, READ BACK BY AND VERIFIED WITH: SULLIVAN,C RN ON 1532 ON 2.24.17 BY EPPERSON,S RESULT CALLED TO, READ BACK BY AND VERIFIED WITH: SULLIVAN,C AT 1532 ON 2.24.17 BY EPPERSON,S      Scheduled Meds: . allantoin   Topical Once per day on Mon Wed Fri  . aspirin  300 mg Rectal Daily  . enoxaparin (LOVENOX) injection  1 mg/kg Subcutaneous Q24H  . furosemide  20 mg Intravenous Daily  . hydrocerin  1 application Topical q morning - 10a  . levothyroxine  25 mcg  Intravenous Daily  . [START ON 08/26/2015] neomycin-bacitracin-polymyxin  1 application Topical Once per day on Mon Thu  . pantoprazole (PROTONIX) IV  40 mg Intravenous Q24H  . piperacillin-tazobactam (ZOSYN)  IV  3.375 g Intravenous Q8H  . sodium chloride flush  3 mL Intravenous Q12H  . vancomycin  1,000 mg Intravenous Q24H   Continuous Infusions:

## 2015-08-24 NOTE — Progress Notes (Addendum)
Palliative consult received. Patient is 82 yo bed bound, mostly non-verbal at baseline from [facility] SN[facility] Care. Progressive dementia over many years. Now admitted with Congestive heart failure and positive blood cultures. Given her advanced age, poor baseline functional status and minimal ability to safely take anything by mouth I would strongly recommend that we transfer her back to [facility] with full hospice services- this will be my strong recommendation to the family-unfortunately I have not been able to reach either contacts by phone- will try later this afternoon.  I cancelled KCl runs due to painful infusion- we need to clarify goals in terms of escalation or provision of acute care. Until we can reach familyfor di  Anderson Malta, [place] [facility] [phone]

## 2015-08-25 DIAGNOSIS — N179 Acute kidney failure, unspecified: Secondary | ICD-10-CM

## 2015-08-25 LAB — BASIC METABOLIC PANEL
ANION GAP: 14 (ref 5–15)
BUN: 13 mg/dL (ref 6–20)
CHLORIDE: 96 mmol/L — AB (ref 101–111)
CO2: 29 mmol/L (ref 22–32)
Calcium: 8.2 mg/dL — ABNORMAL LOW (ref 8.9–10.3)
Creatinine, Ser: 0.98 mg/dL (ref 0.44–1.00)
GFR calc Af Amer: 54 mL/min — ABNORMAL LOW (ref >60)
GFR calc non Af Amer: 46 mL/min — ABNORMAL LOW (ref >60)
GLUCOSE: 80 mg/dL (ref 65–99)
POTASSIUM: 2.7 mmol/L — AB (ref 3.5–5.1)
Sodium: 139 mmol/L (ref 135–145)

## 2015-08-25 LAB — CBC
HEMATOCRIT: 35.9 % — AB (ref 36.0–46.0)
HEMOGLOBIN: 11.4 g/dL — AB (ref 12.0–15.0)
MCH: 28.5 pg (ref 26.0–34.0)
MCHC: 31.8 g/dL (ref 30.0–36.0)
MCV: 89.8 fL (ref 78.0–100.0)
Platelets: 274 10*3/uL (ref 150–400)
RBC: 4 MIL/uL (ref 3.87–5.11)
RDW: 15 % (ref 11.5–15.5)
WBC: 7.1 10*3/uL (ref 4.0–10.5)

## 2015-08-25 MED ORDER — ASPIRIN 300 MG RE SUPP: 300.0000 mg | Freq: Every day | RECTAL | Status: AC

## 2015-08-25 MED ORDER — POTASSIUM CHLORIDE CRYS ER 20 MEQ PO TBCR
40.0000 meq | EXTENDED_RELEASE_TABLET | Freq: Once | ORAL | Status: DC
  Filled 2015-08-25: qty 2

## 2015-08-25 NOTE — Discharge Instructions (Signed)
Deep Vein Thrombosis °A deep vein thrombosis (DVT) is a blood clot (thrombus) that usually occurs in a deep, larger vein of the lower leg or the pelvis, or in an upper extremity such as the arm. These are dangerous and can lead to serious and even life-threatening complications if the clot travels to the lungs. °A DVT can damage the valves in your leg veins so that instead of flowing upward, the blood pools in the lower leg. This is called post-thrombotic syndrome, and it can result in pain, swelling, discoloration, and sores on the leg. °CAUSES °A DVT is caused by the formation of a blood clot in your leg, pelvis, or arm. Usually, several things contribute to the formation of blood clots. A clot may develop when: °· Your blood flow slows down. °· Your vein becomes damaged in some way. °· You have a condition that makes your blood clot more easily. °RISK FACTORS °A DVT is more likely to develop in: °· People who are older, especially over 60 years of age. °· People who are overweight (obese). °· People who sit or lie still for a long time, such as during long-distance travel (over 4 hours), bed rest, hospitalization, or during recovery from certain medical conditions like a stroke. °· People who do not engage in much physical activity (sedentary lifestyle). °· People who have chronic breathing disorders. °· People who have a personal or family history of blood clots or blood clotting disease. °· People who have peripheral vascular disease (PVD), diabetes, or some types of cancer. °· People who have heart disease, especially if the person had a recent heart attack or has congestive heart failure. °· People who have neurological diseases that affect the legs (leg paresis). °· People who have had a traumatic injury, such as breaking a hip or leg. °· People who have recently had major or lengthy surgery, especially on the hip, knee, or abdomen. °· People who have had a central line placed inside a large vein. °· People  who take medicines that contain the hormone estrogen. These include birth control pills and hormone replacement therapy. °· Pregnancy or during childbirth or the postpartum period. °· Long plane flights (over 8 hours). °SIGNS AND SYMPTOMS °Symptoms of a DVT can include:  °· Swelling of your leg or arm, especially if one side is much worse. °· Warmth and redness of your leg or arm, especially if one side is much worse. °· Pain in your arm or leg. If the clot is in your leg, symptoms may be more noticeable or worse when you stand or walk. °· A feeling of pins and needles, if the clot is in the arm. °The symptoms of a DVT that has traveled to the lungs (pulmonary embolism, PE) usually start suddenly and include: °· Shortness of breath while active or at rest. °· Coughing or coughing up blood or blood-tinged mucus. °· Chest pain that is often worse with deep breaths. °· Rapid or irregular heartbeat. °· Feeling light-headed or dizzy. °· Fainting. °· Feeling anxious. °· Sweating. °There may also be pain and swelling in a leg if that is where the blood clot started. °These symptoms may represent a serious problem that is an emergency. Do not wait to see if the symptoms will go away. Get medical help right away. Call your local emergency services (911 in the [place] Do not drive yourself to the hospital. °DIAGNOSIS °Your health care provider will take a medical history and perform a physical exam. You may also   have other tests, including: °· Blood tests to assess the clotting properties of your blood. °· Imaging tests, such as CT, ultrasound, MRI, X-ray, and other tests to see if you have clots anywhere in your body. °TREATMENT °After a DVT is identified, it can be treated. The type of treatment that you receive depends on many factors, such as the cause of your DVT, your risk for bleeding or developing more clots, and other medical conditions that you have. Sometimes, a combination of treatments is necessary. Treatment  options may be combined and include: °· Monitoring the blood clot with ultrasound. °· Taking medicines by mouth, such as newer blood thinners (anticoagulants), thrombolytics, or warfarin. °· Taking anticoagulant medicine by injection or through an IV tube. °· Wearing compression stockings or using different types of devices. °· Surgery (rare) to remove the blood clot or to place a filter in your abdomen to stop the blood clot from traveling to your lungs. °Treatments for a DVT are often divided into immediate treatment and long-term treatment (up to 3 months after DVT). You can work with your health care provider to choose the treatment program that is best for you. °HOME CARE INSTRUCTIONS °If you are taking a newer oral anticoagulant: °· Take the medicine every single day at the same time each day. °· Understand what foods and drugs interact with this medicine. °· Understand that there are no regular blood tests required when using this medicine. °· Understand the side effects of this medicine, including excessive bruising or bleeding. Ask your health care provider or pharmacist about other possible side effects. °If you are taking warfarin: °· Understand how to take warfarin and know which foods can affect how warfarin works in your body. °· Understand that it is dangerous to take too much or too little warfarin. Too much warfarin increases the risk of bleeding. Too little warfarin continues to allow the risk for blood clots. °· Follow your PT and INR blood testing schedule. The PT and INR results allow your health care provider to adjust your dose of warfarin. It is very important that you have your PT and INR tested as often as told by your health care provider. °· Avoid major changes in your diet, or tell your health care provider before you change your diet. Arrange a visit with a registered dietitian to answer your questions. Many foods, especially foods that are high in vitamin K, can interfere with warfarin  and affect the PT and INR results. Eat a consistent amount of foods that are high in vitamin K, such as: °¨ Spinach, kale, broccoli, cabbage, collard greens, turnip greens, Brussels sprouts, peas, cauliflower, seaweed, and parsley. °¨ Beef liver and pork liver. °¨ Green tea. °¨ Soybean oil. °· Tell your health care provider about any and all medicines, vitamins, and supplements that you take, including aspirin and other over-the-counter anti-inflammatory medicines. Be especially cautious with aspirin and anti-inflammatory medicines. Do not take those before you ask your health care provider if it is safe to do so. This is important because many medicines can interfere with warfarin and affect the PT and INR results. °· Do not start or stop taking any over-the-counter or prescription medicine unless your health care provider or pharmacist tells you to do so. °If you take warfarin, you will also need to do these things: °· Hold pressure over cuts for longer than usual. °· Tell your dentist and other health care providers that you are taking warfarin before you have any procedures in which   bleeding may occur. °· Avoid alcohol or drink very small amounts. Tell your health care provider if you change your alcohol intake. °· Do not use tobacco products, including cigarettes, chewing tobacco, and e-cigarettes. If you need help quitting, ask your health care provider. °· Avoid contact sports. °General Instructions °· Take over-the-counter and prescription medicines only as told by your health care provider. Anticoagulant medicines can have side effects, including easy bruising and difficulty stopping bleeding. If you are prescribed an anticoagulant, you will also need to do these things: °¨ Hold pressure over cuts for longer than usual. °¨ Tell your dentist and other health care providers that you are taking anticoagulants before you have any procedures in which bleeding may occur. °¨ Avoid contact sports. °· Wear a medical  alert bracelet or carry a medical alert card that says you have had a PE. °· Ask your health care provider how soon you can go back to your normal activities. Stay active to prevent new blood clots from forming. °· Make sure to exercise while traveling or when you have been sitting or standing for a long period of time. It is very important to exercise. Exercise your legs by walking or by tightening and relaxing your leg muscles often. Take frequent walks. °· Wear compression stockings as told by your health care provider to help prevent more blood clots from forming. °· Do not use tobacco products, including cigarettes, chewing tobacco, and e-cigarettes. If you need help quitting, ask your health care provider. °· Keep all follow-up appointments with your health care provider. This is important. °PREVENTION °Take these actions to decrease your risk of developing another DVT: °· Exercise regularly. For at least 30 minutes every day, engage in: °¨ Activity that involves moving your arms and legs. °¨ Activity that encourages good blood flow through your body by increasing your heart rate. °· Exercise your arms and legs every hour during long-distance travel (over 4 hours). Drink plenty of water and avoid drinking alcohol while traveling. °· Avoid sitting or lying in bed for long periods of time without moving your legs. °· Maintain a weight that is appropriate for your height. Ask your health care provider what weight is healthy for you. °· If you are a woman who is over 35 years of age, avoid unnecessary use of medicines that contain estrogen. These include birth control pills. °· Do not smoke, especially if you take estrogen medicines. If you need help quitting, ask your health care provider. °If you are hospitalized, prevention measures may include: °· Early walking after surgery, as soon as your health care provider says that it is safe. °· Receiving anticoagulants to prevent blood clots. If you cannot take  anticoagulants, other options may be available, such as wearing compression stockings or using different types of devices. °SEEK IMMEDIATE MEDICAL CARE IF: °· You have new or increased pain, swelling, or redness in an arm or leg. °· You have numbness or tingling in an arm or leg. °· You have shortness of breath while active or at rest. °· You have chest pain. °· You have a rapid or irregular heartbeat. °· You feel light-headed or dizzy. °· You cough up blood. °· You notice blood in your vomit, bowel movement, or urine. °These symptoms may represent a serious problem that is an emergency. Do not wait to see if the symptoms will go away. Get medical help right away. Call your local emergency services (911 in the [place] Do not drive yourself to the hospital. °  °  This information is not intended to replace advice given to you by your health care provider. Make sure you discuss any questions you have with your health care provider. °  °Document Released: 06/15/2005 Document Revised: 03/06/2015 Document Reviewed: 10/10/2014 °Elsevier Interactive Patient Education ©2016 [employer] ° °

## 2015-08-25 NOTE — Progress Notes (Signed)
Plan d/c to memory care unit with hospice care. Plan for d/c in AM, awaiting call from son, message left.   Debbora Presto, MD  [facility] Pager [phone]  If 7PM-7AM, please contact night-coverage [email or web address] Password [email or web address]

## 2015-08-25 NOTE — Progress Notes (Signed)
K+ level 2.7 today. On- call provider paged. See palliative care note from 08/24/15.

## 2015-08-25 NOTE — Progress Notes (Signed)
CSW spoke with [facility] coordinator, Melanee Spry, concerning patient return to their facility tomorrow (2/27)- in order for patient to return they will have an RN evaluate the patient in the hospital- Melanee Spry planning to set up an evaluation for tomorrow.  CSW attempted to call pt family to discuss plan for return to [facility]- left message  CSW will continue to follow  Merlyn Lot, [facility] Clinical Social Worker [phone]

## 2015-08-25 NOTE — Clinical Social Work Note (Signed)
Clinical Social Work Assessment  Patient Details  Name: Linda Mccullough MRN: [id number] Date of Birth: 11-27-1914  Date of referral:  08/25/15               Reason for consult:  Facility Placement                Permission sought to share information with:  Family Supports Permission granted to share information::  Yes, Verbal Permission Granted  Name::     Public affairs consultant::  [employer]  Relationship::  son  Contact Information:     Housing/Transportation Living arrangements for the past 2 months:  Assisted Dealer of Information:  Adult Children Patient Interpreter Needed:  None Criminal Activity/Legal Involvement Pertinent to Current Situation/Hospitalization:  No - Comment as needed Significant Relationships:  Adult Children Lives with:  Facility Resident Do you feel safe going back to the place where you live?  Yes Need for family participation in patient care:  Yes (Comment) (decision making)  Care giving concerns:  None- pt is long term care resident at [facility]   Social Worker assessment / plan:  CSW spoke with pt son regarding plan for pt return to memory care unit at [facility].   Employment status:  Retired Database administrator PT Recommendations:  Not assessed at this time Information / Referral to community resources:     Patient/Family's Response to care:  Son is agreeable if that's what medical team is recommending- Per MD note plan is to send pt back to ALF- per PT patient is wheelchair bound at baseline.  Patient/Family's Understanding of and Emotional Response to Diagnosis, Current Treatment, and Prognosis:  No questions or concerns at this time- seems to be realistic about patient needs.  Emotional Assessment Appearance:  Appears stated age Attitude/Demeanor/Rapport:  Unable to Assess Affect (typically observed):  Unable to Assess Orientation:  Oriented to Self Alcohol / Substance use:    Psych  involvement (Current and /or in the community):  No (Comment)  Discharge Needs  Concerns to be addressed:  Care Coordination Readmission within the last 30 days:  No Current discharge risk:  Physical Impairment Barriers to Discharge:  Continued Medical Work up   Izora Ribas, LCSW 08/25/2015, 2:55 PM

## 2015-08-25 NOTE — Discharge Summary (Addendum)
Physician Discharge Summary  Linda Mccullough ONG:[id number] DOB: 11-Mar-1915 DOA: 08/22/2015  PCP: Deforest Hoyles, MD  Admit date: 08/22/2015 Discharge date: 08/26/2015  Recommendations for Outpatient Follow-up:  1. Pt will need to follow up with PCP as needed  2. Please note that blood culture 1/2 from 2/22 was notable for g+ coag neg staph and the significance of this organism is c/w contaminated sample so vancomycin was discontinued and ID doctor recommended no further treatment with ABX  3. Please also note that ECHO was questioning presence of mural thrombus which would normally require further intervention but son is very clear that his mother does not want any interventions and focus is the main comfort  4. Our PMT recommended returning to memory care unit with full hospice service and I agree with this plan 5. Allow comfort feeding as pt able to tolerate and please note that pt has not been able to take much PO  Discharge Diagnoses:  Principal Problem:   Acute encephalopathy Active Problems:   HCAP (healthcare-associated pneumonia)   Positive blood culture   DVT, lower extremity (HCC)   Atrial fibrillation with RVR (HCC), CHADS 3-4   Dementia   Hypertension, essential    Hypothyroid   Chronic diastolic CHF (congestive heart failure) (HCC)   GERD (gastroesophageal reflux disease)   Acute kidney injury (HCC)  Discharge Condition: Stable  Diet recommendation: Comfort feeding    Brief narrative:    82 y.o. female with past medical history of hypertension, dementia, diverticulitis, arthritis, GERD, hypothyroidism who presented to [facility] ED with altered mental status and positive blood culture (GPC in clusters in one of the bottles).  In ED, patient was found to be hypotensive with BP as low as 82/64 which improved to 113/55 with hydration. Lactic acid was WNL, troponin was WNL, BNPwas 56.7, WBC 6.5. Chest x-ray showed bilateral basilar infiltration and vascular congestion consistent  with CHF.  While awaiting final blood cultures she was started empirically on vanco and zosyn.   Assessment/Plan:    Principal Problem: Acute encephalopathy / Dementia without behavioral disturbance  - Stable but rahter somnolent and difficult to awake  - Appreciate PCT consult and recommendations - prognosis guarded and based on my conversation with pt's son, comfort is desired with no aggressive interventions   Active Problems: DVT, lower extremity (HCC) - On full dose AC with Lovenox - will need to discuss again with son as pt is not very comfortable with injections and the facility will not do the injections there but can do PO AC which will be a challenge to give if pt not able to participate - would agree with focus on comfort care and will re address with son in AM   Gram positive cocci, coagulase negative staphylococcus infection  - One of the BCx on 2/22 with coag neg staph, contaminant - D/C zosyn and vanc - Repeat blood cultures show no growth x 1 day  Hypertension, essential  - on IV medications here as unable to take PO - focus on comfort  - may be able to add clonidine patch but will need to reassess BP in  - if pt able to take PO can take her HCTZ   Hypothyroid - TSH slightly above range, 6.7 - currently unable to take PO   Chronic diastolic CHF (congestive heart failure) (HCC) - Compensated - Continue lasix IV here but when discharge, may be tried if pt able to take PO an dif needed  Hypokalemia - repeat BMP in AM  Atrial fibrillation with RVR (HCC) - CHADS 3-4 - On AC with Lovenox, re address AC with son in AM prior to discharge   DVT Prophylaxis  - Lovenox full dose   Code Status: DNR/DNI Family Communication: son over the phone, he is very realistic and clear with focus on comfort  Disposition Plan: to Memory care unit likely by 08/26/2015  IV access:  Peripheral IV  Procedures and diagnostic studies:   Dg Chest 2 View 08/22/2015  Increasing bibasilar opacities, left greater than right with probable left pleural effusion. Increasing central pulmonary vascular prominence. Findings may reflect CHF in the appropriate clinical setting. Alternatively, bibasilar atelectasis or aspiration could have this appearance.   Dg Chest 2 View 08/21/2015 Left basilar airspace disease which could be due to atelectasis in this low volume chest or pneumonia.   Medical Consultants:  PCT  Other Consultants:  PT SLP IAnti-Infectives:   Vanco --> 08/25/2015  Zosyn 08/22/2015 --> 08/24/2015      Discharge Exam: Filed Vitals:   08/24/15 2102 08/25/15 0628  BP: 155/64 144/56  Pulse: 54 66  Temp: 98.4 F (36.9 C) 98.2 F (36.8 C)  Resp: 18 18   Filed Vitals:   08/24/15 0502 08/24/15 1453 08/24/15 2102 08/25/15 0628  BP: 135/61 142/63 155/64 144/56  Pulse: 93 60 54 66  Temp: 98.1 F (36.7 C) 97.7 F (36.5 C) 98.4 F (36.9 C) 98.2 F (36.8 C)  TempSrc: Oral Oral Oral Axillary  Resp:  18 18 18   Height:      Weight: 68.221 kg (150 lb 6.4 oz)   64.184 kg (141 lb 8 oz)  SpO2: 98% 91% 95% 94%    General: Pt is somnolent and difficult to awake, calm and NAD Cardiovascular: Irregular rate and rhythm, no rubs, no gallops Respiratory: Shallow breath sounds and diminished at bases with poor inspiratory effort  Abdominal: Soft, non tender, non distended, bowel sounds +  Discharge Instructions     Medication List    STOP taking these medications        BAZA EX     cefTRIAXone 1 g in dextrose 5 % 50 mL     divalproex 125 MG DR tablet  Commonly known as:  DEPAKOTE     doxycycline 100 MG tablet  Commonly known as:  VIBRA-TABS     Fish Oil 1000 MG Caps     FLEET ENEMA RE     loperamide 2 MG tablet  Commonly known as:  IMODIUM A-D     Melatonin 5 MG Caps     omega-3 acid ethyl esters 1 g capsule  Commonly known as:  LOVAZA     sennosides-docusate sodium 8.6-50 MG tablet  Commonly known as:  SENOKOT-S      sodium chloride irrigation 0.9 % irrigation     Vitamin D3 2000 units capsule      TAKE these medications        acetaminophen 500 MG tablet  Commonly known as:  TYLENOL  Take 500 mg by mouth every 4 (four) hours as needed for mild pain. Take 1 tablet every 6 hours for 1 month (started 8/19)     aspirin 300 MG suppository  Place 1 suppository (300 mg total) rectally daily.     CARRASYN HYDROGEL WOUND DRESS EX  Apply 1 application topically 3 (three) times a week. Apply to wound after cleansing three times per week, per Nursing home, not applied by staff     eucerin cream  Apply 1  application topically every morning. Apply to legs     hydrochlorothiazide 12.5 MG capsule  Commonly known as:  MICROZIDE  Take 12.5 mg by mouth daily.     levothyroxine 50 MCG tablet  Commonly known as:  SYNTHROID, LEVOTHROID  Take 50 mcg by mouth daily before breakfast.     neomycin-bacitracin-polymyxin [phone] ointment  Apply 1 application topically 2 (two) times a week. Cleanse lower forearm with NS, Apply ointment and cover with gauze, not applied by nursing staff     nystatin cream  Commonly known as:  MYCOSTATIN  Apply 1 application topically 4 (four) times daily as needed for dry skin (until healed).     omeprazole 20 MG capsule  Commonly known as:  PRILOSEC  Take 20 mg by mouth daily before breakfast.     risperiDONE 0.25 MG tablet  Commonly known as:  RISPERDAL  Take 0.25 mg by mouth at bedtime. And may take 1 tablet BID PRN for aggitation           The results of significant diagnostics from this hospitalization (including imaging, microbiology, ancillary and laboratory) are listed below for reference.     Microbiology: Recent Results (from the past 240 hour(s))  Culture, blood (Routine X 2) w Reflex to ID Panel     Status: None   Collection Time: 08/21/15 12:15 PM  Result Value Ref Range Status   Specimen Description BLOOD LEFT ANTECUBITAL  Final   Special Requests  BOTTLES DRAWN AEROBIC AND ANAEROBIC 5 CC  Final   Culture  Setup Time   Final    GRAM POSITIVE COCCI IN CLUSTERS AEROBIC BOTTLE ONLY CRITICAL RESULT CALLED TO, READ BACK BY AND VERIFIED WITH: K ROBERTSON RN 2050 08/22/15 A BROWNING    Culture   Final    STAPHYLOCOCCUS SPECIES (COAGULASE NEGATIVE) THE SIGNIFICANCE OF ISOLATING THIS ORGANISM FROM A SINGLE SET OF BLOOD CULTURES WHEN MULTIPLE SETS ARE DRAWN IS UNCERTAIN. PLEASE NOTIFY THE MICROBIOLOGY DEPARTMENT WITHIN ONE WEEK IF SPECIATION AND SENSITIVITIES ARE REQUIRED. Performed at [facility]    Report Status 08/24/2015 FINAL  Final  Culture, blood (Routine X 2) w Reflex to ID Panel     Status: None (Preliminary result)   Collection Time: 08/21/15 12:17 PM  Result Value Ref Range Status   Specimen Description BLOOD RIGHT ANTECUBITAL  Final   Special Requests BOTTLES DRAWN AEROBIC AND ANAEROBIC 5 CC  Final   Culture   Final    NO GROWTH 4 DAYS Performed at [facility]    Report Status PENDING  Incomplete  Blood culture (routine x 2)     Status: None (Preliminary result)   Collection Time: 08/22/15 11:40 PM  Result Value Ref Range Status   Specimen Description BLOOD LEFT ANTECUBITAL  Final   Special Requests BOTTLES DRAWN AEROBIC AND ANAEROBIC 5CC  Final   Culture   Final    NO GROWTH 2 DAYS Performed at [facility]    Report Status PENDING  Incomplete  Blood culture (routine x 2)     Status: None (Preliminary result)   Collection Time: 08/23/15  2:47 AM  Result Value Ref Range Status   Specimen Description BLOOD LEFT FOREARM  Final   Special Requests IN PEDIATRIC BOTTLE  Final   Culture   Final    NO GROWTH 2 DAYS Performed at [facility]    Report Status PENDING  Incomplete  MRSA PCR Screening     Status: Abnormal   Collection  Time: 08/23/15  9:36 AM  Result Value Ref Range Status   MRSA by PCR POSITIVE (A) NEGATIVE Final    Comment:        The GeneXpert MRSA Assay (FDA approved for  NASAL specimens only), is one component of a comprehensive MRSA colonization surveillance program. It is not intended to diagnose MRSA infection nor to guide or monitor treatment for MRSA infections. RESULT CALLED TO, READ BACK BY AND VERIFIED WITH: SULLIVAN,C RN ON 1532 ON 2.24.17 BY EPPERSON,S RESULT CALLED TO, READ BACK BY AND VERIFIED WITH: SULLIVAN,C AT 1532 ON 2.24.17 BY EPPERSON,S      Labs: Basic Metabolic Panel:  Recent Labs Lab 08/21/15 1217 08/22/15 2340 08/24/15 0534 08/25/15 0515  NA 136 140 139 139  K 3.6 3.5 3.0* 2.7*  CL 98* 101 98* 96*  CO2 GLUCOSE 111* 95 84 80  BUN CREATININE 1.03* 0.95 0.91 0.98  CALCIUM 8.1* 8.4* 7.9* 8.2*   CBC:  Recent Labs Lab 08/21/15 1217 08/22/15 2340 08/24/15 0534 08/25/15 0515  WBC 5.7 6.5 4.7 7.1  NEUTROABS 3.6 3.0  --   --   HGB 11.8* 11.7* 11.6* 11.4*  HCT 36.6 37.0 36.2 35.9*  MCV 88.2 90.0 89.2 89.8  PLT 199 244 237 274   Cardiac Enzymes:  Recent Labs Lab 08/22/15 2340 08/23/15 0421 08/23/15 0959 08/23/15 1645  TROPONINI 0.03 0.03 0.03 <0.03   BNP: BNP (last 3 results)  Recent Labs  08/22/15 2340  BNP 156.7*   SIGNED: Time coordinating discharge: 30 minutes  Debbora Presto, MD  [facility] 08/25/2015, 1:34 PM Pager [phone]  If 7PM-7AM, please contact night-coverage [email or web address] Password [email or web address]

## 2015-08-26 ENCOUNTER — Telehealth (HOSPITAL_COMMUNITY): Payer: Self-pay

## 2015-08-26 DIAGNOSIS — I4891 Unspecified atrial fibrillation: Secondary | ICD-10-CM

## 2015-08-26 LAB — CULTURE, BLOOD (ROUTINE X 2): Culture: NO GROWTH

## 2015-08-26 LAB — CBC
HEMATOCRIT: 37.1 % (ref 36.0–46.0)
HEMOGLOBIN: 11.6 g/dL — AB (ref 12.0–15.0)
MCH: 28.2 pg (ref 26.0–34.0)
MCHC: 31.3 g/dL (ref 30.0–36.0)
MCV: 90 fL (ref 78.0–100.0)
Platelets: 284 10*3/uL (ref 150–400)
RBC: 4.12 MIL/uL (ref 3.87–5.11)
RDW: 14.8 % (ref 11.5–15.5)
WBC: 6.7 10*3/uL (ref 4.0–10.5)

## 2015-08-26 LAB — BASIC METABOLIC PANEL
Anion gap: 17 — ABNORMAL HIGH (ref 5–15)
BUN: 16 mg/dL (ref 6–20)
CHLORIDE: 99 mmol/L — AB (ref 101–111)
CO2: 28 mmol/L (ref 22–32)
Calcium: 8.5 mg/dL — ABNORMAL LOW (ref 8.9–10.3)
Creatinine, Ser: 1.24 mg/dL — ABNORMAL HIGH (ref 0.44–1.00)
GFR calc non Af Amer: 35 mL/min — ABNORMAL LOW (ref >60)
GFR, EST AFRICAN AMERICAN: 41 mL/min — AB (ref >60)
Glucose, Bld: 74 mg/dL (ref 65–99)
POTASSIUM: 2.7 mmol/L — AB (ref 3.5–5.1)
SODIUM: 144 mmol/L (ref 135–145)

## 2015-08-26 LAB — LEGIONELLA ANTIGEN, URINE

## 2015-08-26 MED ORDER — ENSURE ENLIVE PO LIQD
237.0000 mL | Freq: Two times a day (BID) | ORAL | Status: DC
  Administered 2015-08-26: 237 mL via ORAL

## 2015-08-26 MED ORDER — POTASSIUM CHLORIDE 10 MEQ/100ML IV SOLN: 10.0000 meq | INTRAVENOUS | Status: DC

## 2015-08-26 MED ORDER — POTASSIUM CHLORIDE ER 10 MEQ PO TBCR: 8.0000 meq | EXTENDED_RELEASE_TABLET | Freq: Every day | ORAL | Status: AC

## 2015-08-26 MED ORDER — POTASSIUM CHLORIDE 20 MEQ/15ML (10%) PO SOLN
40.0000 meq | Freq: Once | ORAL | Status: AC
  Administered 2015-08-26: 40 meq via ORAL
  Filled 2015-08-26: qty 30

## 2015-08-26 MED ORDER — ENSURE ENLIVE PO LIQD: 237.0000 mL | Freq: Two times a day (BID) | ORAL | Status: AC

## 2015-08-26 NOTE — Progress Notes (Signed)
ANTICOAGULATION CONSULT NOTE - Follow up Consult  Pharmacy Consult for Lovenox Indication: DVT  Allergies  Allergen Reactions  . Aricept [Donepezil Hcl] Other (See Comments)    Reaction Unknown  . Codeine Other (See Comments)    Reaction Unknown  . Hydrocodone Other (See Comments)    Reaction Unknown  . Meloxicam Other (See Comments)    Reaction Unknown  . Sulfa Antibiotics Other (See Comments)    Reaction Unknown    Patient Measurements: Height:  (170.2 cm) Weight: 141 lb 8.6 oz (64.2 kg) IBW/kg (Calculated) : 61.6  Labs:  Recent Labs  08/23/15 0959 08/23/15 1645  08/24/15 0534 08/25/15 0515 08/26/15 0515  HGB  --   --   < > 11.6* 11.4* 11.6*  HCT  --   --   --  36.2 35.9* 37.1  PLT  --   --   --  237 274 284  CREATININE  --   --   --  0.91 0.98 1.24*  TROPONINI 0.03 <0.03  --   --   --   --   < > = values in this interval not displayed.  Estimated Creatinine Clearance: 24.6 mL/min (by C-G formula based on Cr of 1.24).  Assessment: 56 yoF with PMHx dementia, diverticulitis, hypothyroidism, and HTN sent to [facility] from SNF on 2/22 for cough and CXR showed questionable PNA but normal WBC and afebrile, discharged back to facility. Pt returned 2/23 with AMS and positive blood cultures 1 of 2 GPC in clusters, also noted to have bilateral leg edema.  Pt started on empiric antibiotics for PNA and now dopplers show bilateral DVTs (age indeterminate).  Pharmacy consulted to start lovenox for VTE treatment.   Today, 08/26/2015: SCr 1.24 with CrCl ~ 24 ml/min CBC:  Hgb 11.6 remains low/stable, Plt remain WNL  Goal of Therapy:  Anti-Xa level 0.6-1 units/ml 4hrs after LMWH dose given Monitor platelets by anticoagulation protocol: Yes   Plan:   Continue Lovenox  ( /kg) SQ q24h  Dosage remains stable and need for further dosage adjustment appears unlikely at present.  Pharmacy will sign off at this time.  Please reconsult if a change in clinical status warrants  re-evaluation of dosage.  Lynann Beaver PharmD, BCPS Pager [phone] 08/26/2015 8:22 AM

## 2015-08-26 NOTE — Progress Notes (Signed)
Report called to [facility] Orlean Bradford Rn accepting report for the facility. VSS and no acute changes noted in Pt's assessment at time of discharge.

## 2015-08-26 NOTE — Progress Notes (Signed)
[facility]  [facility] is providing the following services: Hospital Bed (to be delivered to [facility])  If patient discharges after hours, please call [phone].   Renard Hamper 08/26/2015, 1:36 PM

## 2015-08-26 NOTE — Progress Notes (Signed)
Pt stable for discharge Please refer to discharge summary done 08/25/15 Added low dose potassium supplementation on discharge  Manson Passey [facility] [phone]

## 2015-08-26 NOTE — Progress Notes (Signed)
Initial Nutrition Assessment  DOCUMENTATION CODES:   Not applicable  INTERVENTION:  - Continue Ensure Enlive po BID, each supplement provides 350 kcal and 20 grams of protein - Assist pt with meals as needed - RD will continue to monitor for needs  NUTRITION DIAGNOSIS:   Inadequate oral intake related to lethargy/confusion, poor appetite as evidenced by meal completion < 25%.  GOAL:   Patient will meet greater than or equal to 90% of their needs  MONITOR:   PO intake, Supplement acceptance, Weight trends, Labs, Skin, I & O's  REASON FOR ASSESSMENT:   Malnutrition Screening Tool  ASSESSMENT:   82 y.o. female with PMH of hypertension, dementia, diverticulitis, arthritis, GERD, hypothyroidism, who presents with altered mental status and positive blood culture 1/2.  Pt seen for MST. BMI indicates normal weight. Per chart review, pt ate 15% of breakfast this AM. Per notes and interaction with pt, she is mainly nonverbal; hx of dementia. Pt was unable to provide any information and no family/visitors at bedside at time of RD visit. Pt is being followed by Palliative care. Per notes from Palliative care and MD pt to d/c to facility with hospice services; d/c summary in place from 2/26 and MD states that this remains appropriate for d/c today.  Pt not meeting needs. Physical assessment done to upper body only and no muscle or fat wasting noted. Per chart review, pt has gained 11 lbs in the past 10 months.   Not meeting needs. No nutrition interventions appropriate at this time. Medications reviewed. Labs reviewed; K: 2.7 mmol/L, Cl: 99 mmol/L, creatinine elevated, Ca: 8.5 mg/dL, GFR: 35.   Diet Order:  DIET - DYS 1 Room service appropriate?: No; Fluid consistency:: Thin  Skin:  Wound (see comment) (L leg cellulitis)  Last BM:  2/24  Height:   Ht Readings from Last 1 Encounters:  08/23/15  (1.702 m)    Weight:   Wt Readings from Last 1 Encounters:  08/26/15 141 lb 8.6  oz (64.2 kg)    Ideal Body Weight:  61.36 kg (kg)  BMI:  Body mass index is 22.16 kg/(m^2).  Estimated Nutritional Needs:   Kcal:  1250-1400  Protein:  50-60 grams  Fluid:  1.5-1.7 L/day  EDUCATION NEEDS:   No education needs identified at this time     Trenton Gammon, RD, LDN Inpatient Clinical Dietitian Pager # [phone] After hours/weekend pager # [phone]

## 2015-08-26 NOTE — NC FL2 (Signed)
[place] MEDICAID FL2 LEVEL OF CARE SCREENING TOOL     IDENTIFICATION  Patient Name: Linda Mccullough Birthdate: 1915-03-31 Sex: female Admission Date (Current Location): 08/22/2015  [facility] and [place] Number:  Producer, television/film/video and Address:  [facility],  [address] [place] [address]      Provider Number: [id number]  Attending Physician Name and Address:  Alison Murray, MD  Relative Name and Phone Number:       Current Level of Care: Hospital Recommended Level of Care: Assisted Living Facility Prior Approval Number:    Date Approved/Denied:   PASRR Number:    Discharge Plan: Other (Comment) (ALF)    Current Diagnoses: Patient Active Problem List   Diagnosis Date Noted  . Chronic diastolic CHF (congestive heart failure) (HCC) 08/23/2015  . GERD (gastroesophageal reflux disease) 08/23/2015  . HCAP (healthcare-associated pneumonia) 08/23/2015  . Acute encephalopathy 08/23/2015  . DVT, lower extremity (HCC) 08/23/2015  . Acute kidney injury (HCC) 08/23/2015  . Atrial fibrillation with RVR (HCC), CHADS 3-4 08/23/2015  . Positive blood culture   . Hypertension, essential    . Hypothyroid   . Dementia     Orientation RESPIRATION BLADDER Height & Weight     Self  O2 (2 L [place]) Incontinent Weight: 141 lb 8.6 oz (64.2 kg) Height:   (170.2 cm)  BEHAVIORAL SYMPTOMS/MOOD NEUROLOGICAL BOWEL NUTRITION STATUS      Continent Dysphasia 1 - Comfort feeding   AMBULATORY STATUS COMMUNICATION OF NEEDS Skin   Total Care Verbally Normal                       Personal Care Assistance Level of Assistance  Bathing, Dressing Bathing Assistance: Limited assistance   Dressing Assistance: Limited assistance     Functional Limitations Info             SPECIAL CARE FACTORS FREQUENCY                       Contractures      Additional Factors Info  Code Status, Allergies, Isolation Precautions Code Status Info: DNR Allergies Info:  Aricept, Codeine, Hydrocodone, Meloxicam, Sulfa Antibiotics     Isolation Precautions Info: MRSA      Discharge Medications: Medication List    STOP taking these medications       BAZA EX     cefTRIAXone 1 g in dextrose 5 % 50 mL     divalproex 125 MG DR tablet  Commonly known as: DEPAKOTE     doxycycline 100 MG tablet  Commonly known as: VIBRA-TABS     Fish Oil 1000 MG Caps     FLEET ENEMA RE     loperamide 2 MG tablet  Commonly known as: IMODIUM A-D     Melatonin 5 MG Caps     omega-3 acid ethyl esters 1 g capsule  Commonly known as: LOVAZA     sennosides-docusate sodium 8.6-50 MG tablet  Commonly known as: SENOKOT-S     sodium chloride irrigation 0.9 % irrigation     Vitamin D3 2000 units capsule      TAKE these medications       acetaminophen 500 MG tablet  Commonly known as: TYLENOL  Take 500 mg by mouth every 4 (four) hours as needed for mild pain. Take 1 tablet every 6 hours for 1 month (started 8/19)     aspirin 300 MG suppository  Place 1 suppository (300  mg total) rectally daily.     CARRASYN HYDROGEL WOUND DRESS EX  Apply 1 application topically 3 (three) times a week. Apply to wound after cleansing three times per week, per Nursing home, not applied by staff     eucerin cream  Apply 1 application topically every morning. Apply to legs     hydrochlorothiazide 12.5 MG capsule  Commonly known as: MICROZIDE  Take 12.5 mg by mouth daily.     levothyroxine 50 MCG tablet  Commonly known as: SYNTHROID, LEVOTHROID  Take 50 mcg by mouth daily before breakfast.     neomycin-bacitracin-polymyxin [phone] ointment  Apply 1 application topically 2 (two) times a week. Cleanse lower forearm with NS, Apply ointment and cover with gauze, not applied by nursing staff     nystatin cream  Commonly known as: MYCOSTATIN  Apply 1 application topically 4 (four)  times daily as needed for dry skin (until healed).     omeprazole 20 MG capsule  Commonly known as: PRILOSEC  Take 20 mg by mouth daily before breakfast.     risperiDONE 0.25 MG tablet  Commonly known as: RISPERDAL  Take 0.25 mg by mouth at bedtime. And may take 1 tablet BID PRN for aggitation           Relevant Imaging Results:  Relevant Lab Results:   Additional Information SS#: [id number]  Arlyss Repress, LCSW

## 2015-08-26 NOTE — Telephone Encounter (Signed)
Post ED Visit - Positive Culture Follow-up  Culture report reviewed by antimicrobial stewardship pharmacist:   Enzo Bi, Pharm.D.  Celedonio Miyamoto, Pharm.D., BCPS  Garvin Fila, Pharm.D.  Georgina Pillion, Pharm.D., BCPS  [place], [place].D., BCPS, AAHIVP  Estella Husk, Pharm.D., BCPS, AAHIVP  Cassie Stewart, [address].D.  Sherle Poe, [address].D. X  Rachel Rumbarger, Pharm.D. (+)Bld cx  Pt was admitted to [facility] on vancomycin  Arvid Right 08/26/2015, 6:08 AM

## 2015-08-26 NOTE — Progress Notes (Signed)
[facility] arrived to transport pt to [facility], report given, VSS, no acute changes in pts condition at time of discharge.

## 2015-08-26 NOTE — Progress Notes (Signed)
Patient is set to discharge back to [facility] today. CSW confirmed with Melanee Spry at [facility] that they would be able to take patient back. Patient & son, Leonette Most aware. Discharge packet given to RN, Victorino Dike.   [facility] will be called for transport once hospital bed has been delivered to ALF.     Lincoln Maxin, LCSW [facility] Clinical Social Worker cell #: [phone]

## 2015-08-26 NOTE — Care Management Note (Signed)
Case Management Note  Patient Details  Name: Linda Mccullough MRN: [id number] Date of Birth: [birth year]  Subjective/Objective:   Hospital bed ordered.Spoke to [facility] rep aware of d/c & returning back to [facility].Delivery of hospital bed to ALF before patient can be d/c back to ALF. CSW managing transp.               Action/Plan:d/c back to ALF-[facility].   Expected Discharge Date:                  Expected Discharge Plan:  Assisted Living / Rest Home  In-House Referral:  Clinical Social Work  Discharge planning Services  CM Consult  Post Acute Care Choice:    Choice offered to:     DME Arranged:  Hospital bed DME Agency:  [facility]  HH Arranged:    [facility] Agency:     Status of Service:  Completed, signed off  Medicare Important Message Given:    Date Medicare IM Given:    Medicare IM give by:    Date Additional Medicare IM Given:    Additional Medicare Important Message give by:     If discussed at Long Length of Stay Meetings, dates discussed:    Additional Comments:  Lanier Clam, RN 08/26/2015, 12:46 PM

## 2015-08-28 LAB — CULTURE, BLOOD (ROUTINE X 2)
Culture: NO GROWTH
Culture: NO GROWTH

## 2015-08-30 IMAGING — CR DG CHEST 2V
3 series · 3 of 3 positions shown · non-contrast
Comparison: None.

CLINICAL DATA: Fall.  Dementia.

EXAM:
CHEST  2 VIEW

[w chest pa]
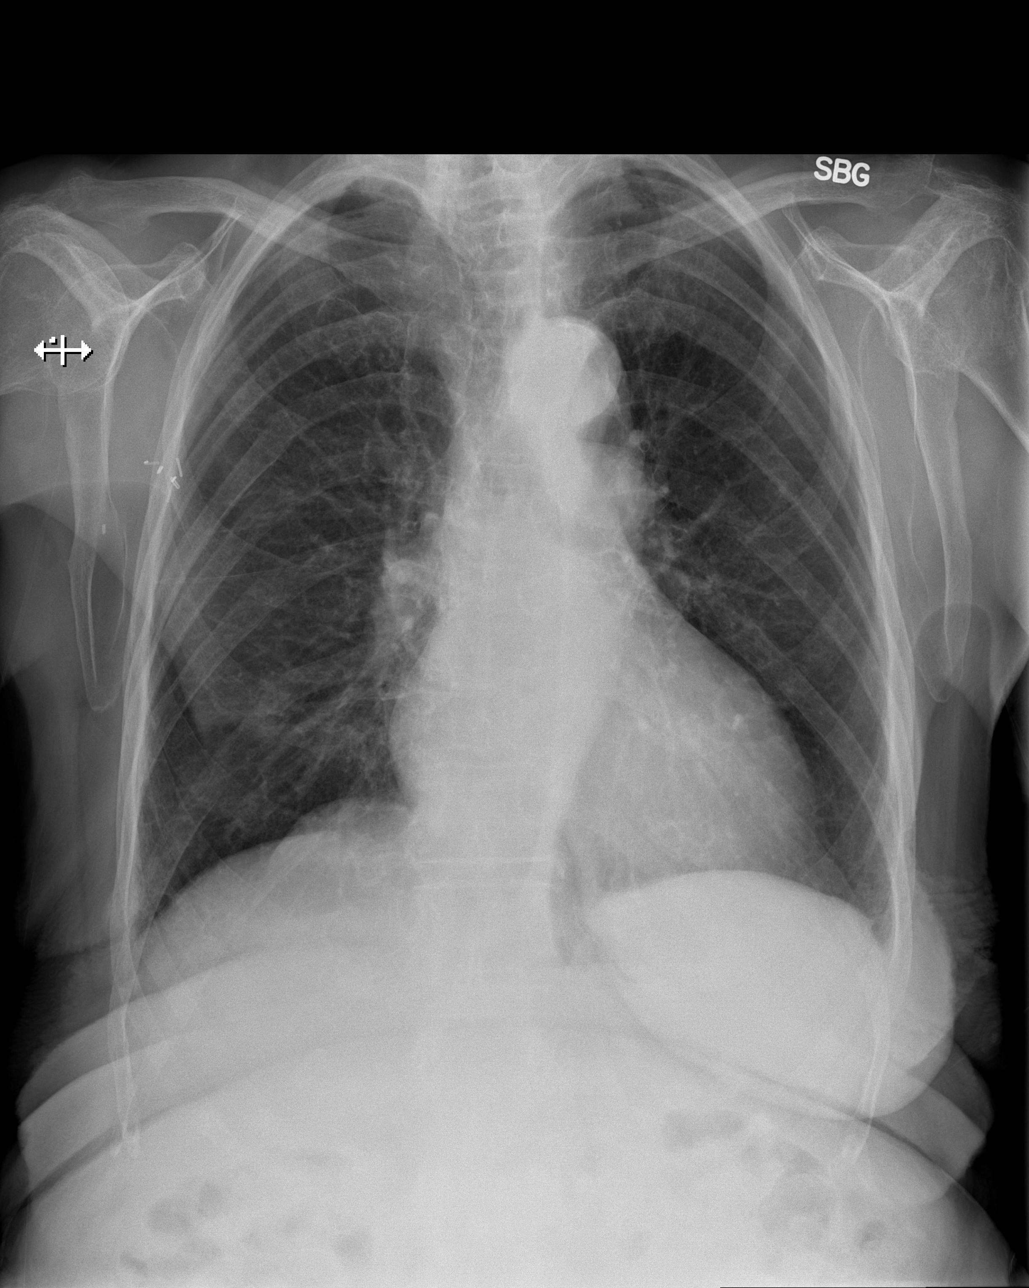

[w chest lat (1 of 2)]
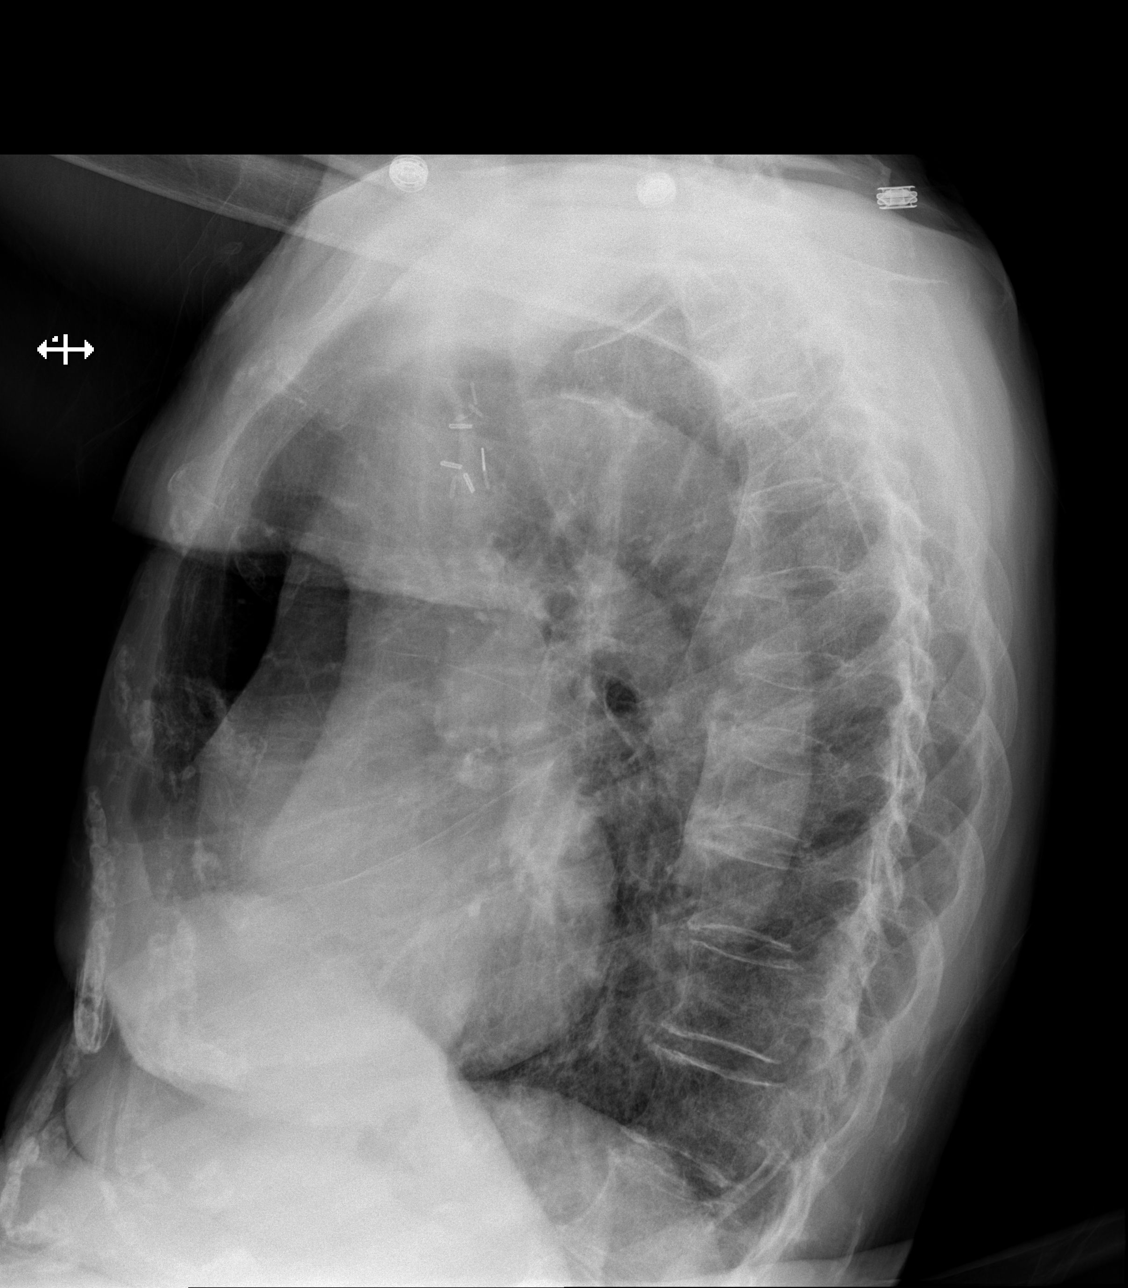

[w chest lat (2 of 2)]
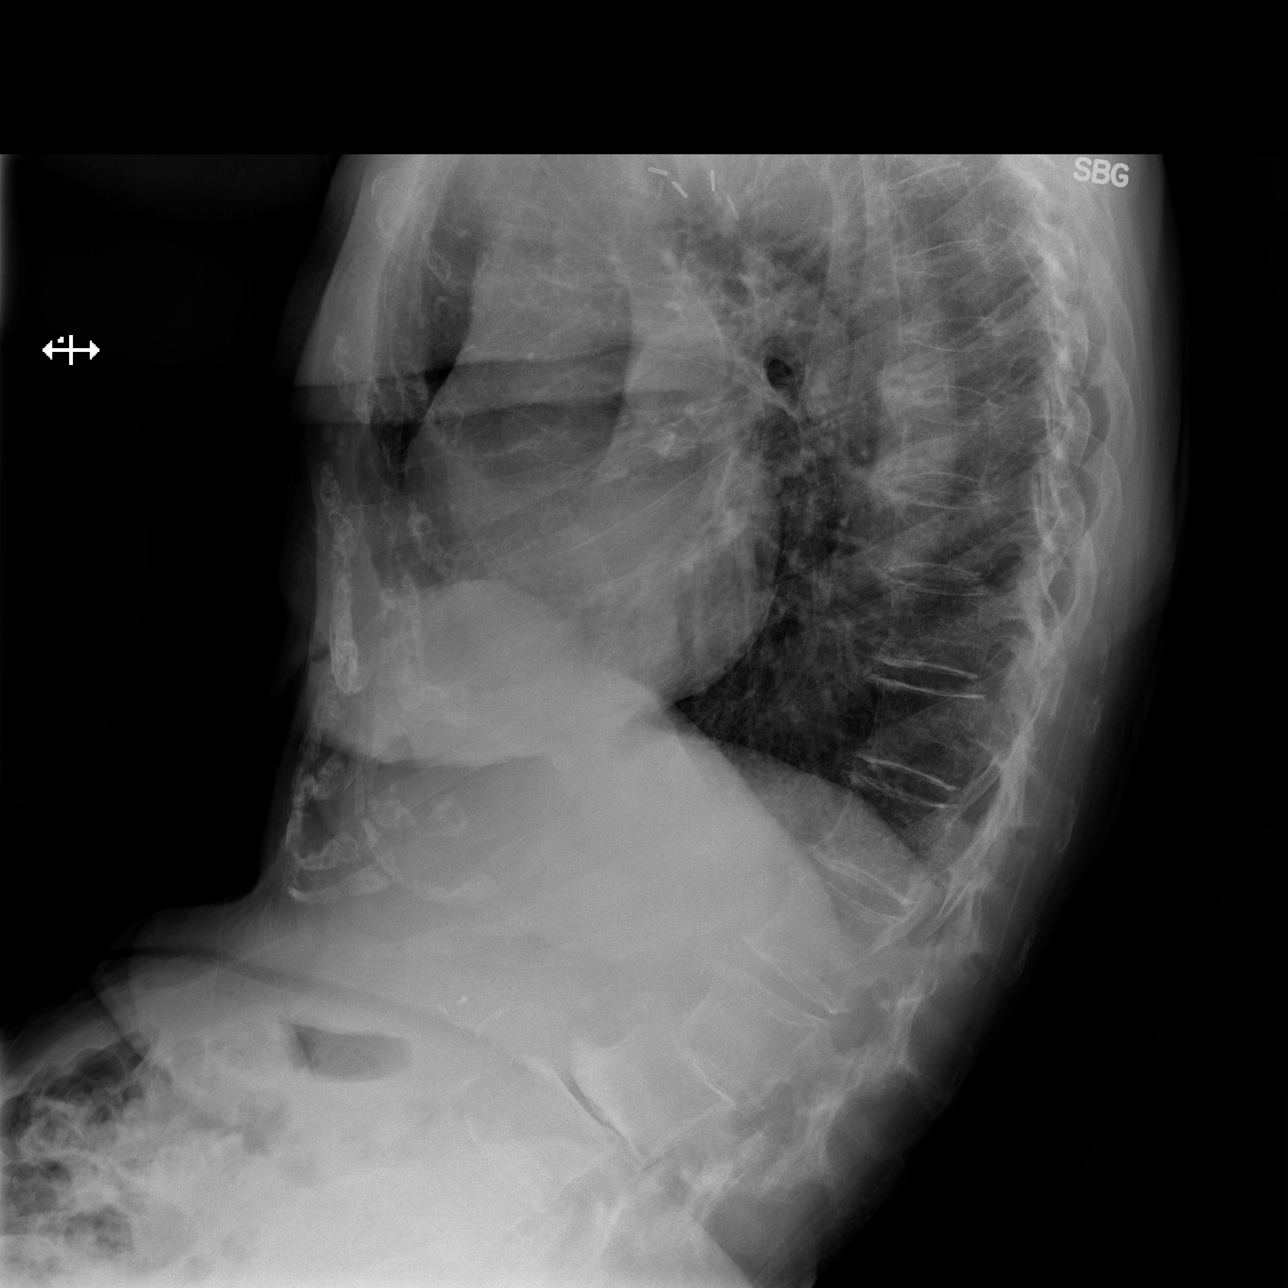

[3 of 3 positions shown; findings below may reference images not displayed]

FINDINGS: The heart size and mediastinal contours are within normal limits.
Both lungs are clear. The bones appear diffusely osteopenic.
IMPRESSION: No active cardiopulmonary disease.

## 2015-11-01 ENCOUNTER — Emergency Department (HOSPITAL_COMMUNITY)
Admission: EM | Admit: 2015-11-01 | Discharge: 2015-11-02 | Disposition: A | Discharge status: Home / Self Care | Payer: Medicare Other | Attending: Emergency Medicine | Admitting: Emergency Medicine

## 2015-11-01 ENCOUNTER — Encounter (HOSPITAL_COMMUNITY): Payer: Self-pay

## 2015-11-01 ENCOUNTER — Emergency Department (HOSPITAL_COMMUNITY): Payer: Medicare Other

## 2015-11-01 DIAGNOSIS — Y9389 Activity, other specified: Secondary | ICD-10-CM | POA: Diagnosis not present

## 2015-11-01 DIAGNOSIS — Z79899 Other long term (current) drug therapy: Secondary | ICD-10-CM | POA: Insufficient documentation

## 2015-11-01 DIAGNOSIS — M1990 Unspecified osteoarthritis, unspecified site: Secondary | ICD-10-CM | POA: Diagnosis not present

## 2015-11-01 DIAGNOSIS — I10 Essential (primary) hypertension: Secondary | ICD-10-CM | POA: Insufficient documentation

## 2015-11-01 DIAGNOSIS — Y92129 Unspecified place in nursing home as the place of occurrence of the external cause: Secondary | ICD-10-CM | POA: Insufficient documentation

## 2015-11-01 DIAGNOSIS — Z043 Encounter for examination and observation following other accident: Secondary | ICD-10-CM | POA: Diagnosis present

## 2015-11-01 DIAGNOSIS — S0003XA Contusion of scalp, initial encounter: Secondary | ICD-10-CM | POA: Diagnosis not present

## 2015-11-01 DIAGNOSIS — W19XXXA Unspecified fall, initial encounter: Secondary | ICD-10-CM

## 2015-11-01 DIAGNOSIS — S29002A Unspecified injury of muscle and tendon of back wall of thorax, initial encounter: Secondary | ICD-10-CM | POA: Insufficient documentation

## 2015-11-01 DIAGNOSIS — S0081XA Abrasion of other part of head, initial encounter: Secondary | ICD-10-CM | POA: Insufficient documentation

## 2015-11-01 DIAGNOSIS — Z7982 Long term (current) use of aspirin: Secondary | ICD-10-CM | POA: Diagnosis not present

## 2015-11-01 DIAGNOSIS — K219 Gastro-esophageal reflux disease without esophagitis: Secondary | ICD-10-CM | POA: Insufficient documentation

## 2015-11-01 DIAGNOSIS — E039 Hypothyroidism, unspecified: Secondary | ICD-10-CM | POA: Insufficient documentation

## 2015-11-01 DIAGNOSIS — W1839XA Other fall on same level, initial encounter: Secondary | ICD-10-CM | POA: Insufficient documentation

## 2015-11-01 DIAGNOSIS — F0390 Unspecified dementia without behavioral disturbance: Secondary | ICD-10-CM | POA: Diagnosis not present

## 2015-11-01 DIAGNOSIS — Y998 Other external cause status: Secondary | ICD-10-CM | POA: Insufficient documentation

## 2015-11-01 HISTORY — DX: Unspecified fall, initial encounter: W19.XXXA

## 2015-11-01 HISTORY — DX: Repeated falls: R29.6

## 2015-11-01 NOTE — ED Notes (Signed)
Patient [facility].  Patient had unwitnessed fall approx 90min PTA.  Patient has abrasion to left forehead and hematoma to right parietal area.  Patient appropriate behavior at baseline.  Not taking blood thinners. No other complaints.

## 2015-11-01 NOTE — ED Notes (Signed)
Bed: WA14 Expected date:  Expected time:  Means of arrival:  Comments: fall 

## 2015-11-01 NOTE — ED Provider Notes (Signed)
CSN: [id number]     Arrival date & time 11/01/15  2214 History  By signing my name below, I, Marisue Humble, attest that this documentation has been prepared under the direction and in the presence of non-physician practitioner, Dierdre Forth, PA-C,. Electronically Signed: Marisue Humble, Scribe. 11/01/2015. 11:12 PM.   Chief Complaint  Patient presents with  . Fall   LEVEL 5 CAVEAT due to pt dementia  The history is provided by the patient and medical records. The history is limited by the condition of the patient. No language interpreter was used.   HPI Comments:  Linda Mccullough is a 82 y.o. female with PMHx of dementia, arthritis and HTN who presents to the Emergency Department via EMS s/p unwitnessed fall ~2 hours ago. Pt states she did not fall tonight. Records report pt has a h/o falls. Pt is not currently on a blood thinner. Pt denies abdominal pain, chest pain, numbness or leg pain currently.   She is pleasant and interactive. Alert to person only.  Past Medical History  Diagnosis Date  . Dementia   . Hypothyroid   . Arthritis   . Hypertension   . GERD (gastroesophageal reflux disease)   . Diverticulitis   . Falls    History reviewed. No pertinent past surgical history. History reviewed. No pertinent family history. Social History  Substance Use Topics  . Smoking status: Never Smoker   . Smokeless tobacco: None  . Alcohol Use: No   OB History    No data available     Review of Systems  Unable to perform ROS: Dementia   Allergies  Aricept; Codeine; Hydrocodone; Meloxicam; and Sulfa antibiotics  Home Medications   Prior to Admission medications   Medication Sig Start Date End Date Taking? Authorizing Provider  acetaminophen (TYLENOL) 500 MG tablet Take 500 mg by mouth every 4 (four) hours as needed for mild pain. Take 1 tablet every 6 hours for 1 month (started 8/19)    Historical Provider, MD  aspirin 300 MG suppository Place 1 suppository (300 mg total)  rectally daily. 08/25/15   Dorothea Ogle, MD  feeding supplement, ENSURE ENLIVE, (ENSURE ENLIVE) LIQD Take 237 mLs by mouth 2 (two) times daily between meals. 08/26/15   Alison Murray, MD  hydrochlorothiazide (MICROZIDE) 12.5 MG capsule Take 12.5 mg by mouth daily.    Historical Provider, MD  levothyroxine (SYNTHROID, LEVOTHROID) 50 MCG tablet Take 50 mcg by mouth daily before breakfast.     Historical Provider, MD  neomycin-bacitracin-polymyxin (NEOSPORIN) [phone] ointment Apply 1 application topically 2 (two) times a week. Cleanse lower forearm with NS, Apply ointment and cover with gauze, not applied by nursing staff    Historical Provider, MD  nystatin cream (MYCOSTATIN) Apply 1 application topically 4 (four) times daily as needed for dry skin (until healed).    Historical Provider, MD  omeprazole (PRILOSEC) 20 MG capsule Take 20 mg by mouth daily before breakfast.     Historical Provider, MD  potassium chloride (K-DUR) 10 MEQ tablet Take 1 tablet (10 mEq total) by mouth daily. 08/26/15   Alison Murray, MD  risperiDONE (RISPERDAL) 0.25 MG tablet Take 0.25 mg by mouth at bedtime. And may take 1 tablet BID PRN for aggitation    Historical Provider, MD  Skin Protectants, Misc. (EUCERIN) cream Apply 1 application topically every morning. Apply to legs    Historical Provider, MD  Wound Dressings (CARRASYN HYDROGEL WOUND DRESS EX) Apply 1 application topically 3 (three) times a week. Apply  to wound after cleansing three times per week, per Nursing home, not applied by staff    Historical Provider, MD   BP 149/80 mmHg  Pulse 64  Temp(Src) 98.3 F (36.8 C) (Oral)  Resp 18  SpO2 99% Physical Exam  Constitutional: She is oriented to person, place, and time. She appears well-developed and well-nourished. No distress.  HENT:  Head: Normocephalic.  Right Ear: External ear normal.  Left Ear: External ear normal.  Nose: Nose normal.  Mouth/Throat: Oropharynx is clear and moist.  Abrasion to the left  forehead Contusion to the right lateral scalp without laceration  Eyes: Conjunctivae and EOM are normal. Pupils are equal, round, and reactive to light. No scleral icterus.  No horizontal, vertical or rotational nystagmus  Neck: Neck supple.  Towel roll in place to prevent cervical movement No midline or paraspinal tenderness   Cardiovascular: Normal rate, regular rhythm, normal heart sounds and intact distal pulses.   No murmur heard. Pulmonary/Chest: Effort normal and breath sounds normal. No respiratory distress. She has no wheezes. She has no rales.  No contusion No tenderness to palpation Clear and equal breath sounds No crepitus, flail segment or deformity  Abdominal: Soft. Bowel sounds are normal. There is no tenderness. There is no rebound and no guarding.  Soft and nontender No contusions or ecchymosis  Musculoskeletal: Normal range of motion.  Full range of motion of the T-spine and L-spine Tenderness to palpation of the midline or paraspinal muscles  Lymphadenopathy:    She has no cervical adenopathy.  Neurological: She is alert and oriented to person, place, and time. She has normal reflexes. No cranial nerve deficit. She exhibits normal muscle tone. Coordination normal.  Mental Status:  Alert, follows commands without difficulty Cranial Nerves:  II:  pupils equal, round, reactive to light III,IV, VI: ptosis not present, extra-ocular motions intact bilaterally  V,VII: smile symmetric, facial light touch sensation equal VIII: hearing grossly normal bilaterally  IX,X: midline uvula rise  XI: bilateral shoulder shrug equal and strong XII: midline tongue extension  Motor:  5/5 in upper and lower extremities bilaterally including strong and equal grip strength and dorsiflexion/plantar flexion Sensory: Pinprick and light touch normal in all extremities.  Deep Tendon Reflexes: 2+ and symmetric  Cerebellar: normal finger-to-nose with bilateral upper extremities Gait: Gait  testing deferred CV: distal pulses palpable throughout   Skin: Skin is warm and dry. No rash noted. She is not diaphoretic.  Psychiatric: She has a normal mood and affect. Her behavior is normal. Judgment and thought content normal.  Nursing note and vitals reviewed.   ED Course  Procedures  DIAGNOSTIC STUDIES:  Oxygen Saturation is 100% on RA, normal by my interpretation.    COORDINATION OF CARE:  11:11 PM Will order head CT. Discussed treatment plan with pt at bedside and pt agreed to plan.   Imaging Review Ct Head Wo Contrast  11/01/2015  CLINICAL DATA:  Unwitnessed fall 90 minutes prior to admission. Abrasion to the left forehead. Hematoma to the right parietal area. EXAM: CT HEAD WITHOUT CONTRAST CT CERVICAL SPINE WITHOUT CONTRAST TECHNIQUE: Multidetector CT imaging of the head and cervical spine was performed following the standard protocol without intravenous contrast. Multiplanar CT image reconstructions of the cervical spine were also generated. COMPARISON:  09/29/2014 FINDINGS: CT HEAD FINDINGS Subcutaneous scalp hematoma over the right posterior parietal region. Diffuse cerebral atrophy. Ventricular dilatation consistent with central atrophy. Low-attenuation changes in the white matter consistent with small vessel ischemia. No mass effect or midline  shift. No abnormal extra-axial fluid collections. Gray-white matter junctions are distinct. Basal cisterns are not effaced. No evidence of acute intracranial hemorrhage. No depressed skull fractures. Mucosal thickening in the paranasal sinuses. Mastoid air cells are not opacified. Vascular calcifications. CT CERVICAL SPINE FINDINGS Degenerative changes throughout the cervical spine with narrowed cervical interspaces and endplate hypertrophic changes throughout. Prominent anterior bridging osteophytes from C4 through C7. Osteophytes may protrude upon the hypopharynx. Slight anterior subluxation of C2 on C3. Alignment is unchanged since  previous study and is likely degenerative. Diffuse degenerative changes throughout the cervical facet joints. Normal alignment of the facet joints. C1-2 articulation appears intact. No vertebral compression deformities. No prevertebral soft tissue swelling. No focal bone lesion or bone destruction. Soft tissues are unremarkable. Vascular calcifications. IMPRESSION: No acute intracranial abnormalities. Chronic atrophy and small vessel ischemic changes. Prominent degenerative changes throughout the cervical spine. Alignment is unchanged since prior study. No acute displaced fractures identified. Electronically Signed   By: Burman Nieves M.D.   On: 11/01/2015 23:59   Ct Cervical Spine Wo Contrast  11/01/2015  CLINICAL DATA:  Unwitnessed fall 90 minutes prior to admission. Abrasion to the left forehead. Hematoma to the right parietal area. EXAM: CT HEAD WITHOUT CONTRAST CT CERVICAL SPINE WITHOUT CONTRAST TECHNIQUE: Multidetector CT imaging of the head and cervical spine was performed following the standard protocol without intravenous contrast. Multiplanar CT image reconstructions of the cervical spine were also generated. COMPARISON:  09/29/2014 FINDINGS: CT HEAD FINDINGS Subcutaneous scalp hematoma over the right posterior parietal region. Diffuse cerebral atrophy. Ventricular dilatation consistent with central atrophy. Low-attenuation changes in the white matter consistent with small vessel ischemia. No mass effect or midline shift. No abnormal extra-axial fluid collections. Gray-white matter junctions are distinct. Basal cisterns are not effaced. No evidence of acute intracranial hemorrhage. No depressed skull fractures. Mucosal thickening in the paranasal sinuses. Mastoid air cells are not opacified. Vascular calcifications. CT CERVICAL SPINE FINDINGS Degenerative changes throughout the cervical spine with narrowed cervical interspaces and endplate hypertrophic changes throughout. Prominent anterior bridging  osteophytes from C4 through C7. Osteophytes may protrude upon the hypopharynx. Slight anterior subluxation of C2 on C3. Alignment is unchanged since previous study and is likely degenerative. Diffuse degenerative changes throughout the cervical facet joints. Normal alignment of the facet joints. C1-2 articulation appears intact. No vertebral compression deformities. No prevertebral soft tissue swelling. No focal bone lesion or bone destruction. Soft tissues are unremarkable. Vascular calcifications. IMPRESSION: No acute intracranial abnormalities. Chronic atrophy and small vessel ischemic changes. Prominent degenerative changes throughout the cervical spine. Alignment is unchanged since prior study. No acute displaced fractures identified. Electronically Signed   By: Burman Nieves M.D.   On: 11/01/2015 23:59   I have personally reviewed and evaluated these images and lab results as part of my medical decision-making.   MDM   Final diagnoses:  Fall, initial encounter   Shakeera Rightmyer resents that unwitnessed fall. Patient with long history of frequent falls. Highly doubt syncope tonight. She is alert and interactive. Contusion to the right side of the head and abrasion to the left.  CT scan shows no acute injury to cervical spine. No intracranial hemorrhage. No other acute findings.  She remains alert. She continues to deny pain.  She has tolerated by mouth without difficulty. We'll plan for discharge back to facility.  The patient was discussed with and seen by Dr. Renato Shin who agrees with the treatment plan.   I personally performed the services described in this documentation, which was  scribed in my presence. The recorded information has been reviewed and is accurate.   Dahlia Client Aaima Gaddie, PA-C 11/02/15 1610  Doug Sou, MD 11/02/15 1501

## 2015-11-02 NOTE — ED Notes (Signed)
Attempted to call report. No answer.

## 2015-11-02 NOTE — ED Provider Notes (Signed)
Patient reportedly fell at skilled nursing facility earlier tonight. She denies pain anywhere. Patient is alert moving all extremities  Doug SouSam Michalina Calbert, MD 11/02/15 [id number]

## 2015-11-02 NOTE — ED Notes (Signed)
[facility] called  

## 2015-11-02 NOTE — Discharge Instructions (Signed)
1. Medications: usual home medications 2. Treatment: rest, drink plenty of fluids,  3. Follow Up: Please followup with your primary doctor in 2-3 days for discussion of your diagnoses and further evaluation after today's visit; if you do not have a primary care doctor use the resource guide provided to find one; Please return to the ER for further falls or other concerns

## 2015-11-02 NOTE — ED Notes (Signed)
[facility] arrived.  

## 2015-11-03 IMAGING — CR DG SHOULDER 2+V*R*
2 series · 2 of 2 positions shown · non-contrast
Comparison: None.

CLINICAL DATA: Fall with right shoulder pain.

EXAM:
RIGHT SHOULDER - 2+ VIEW

[x shoulder ap right (1 of 2)]
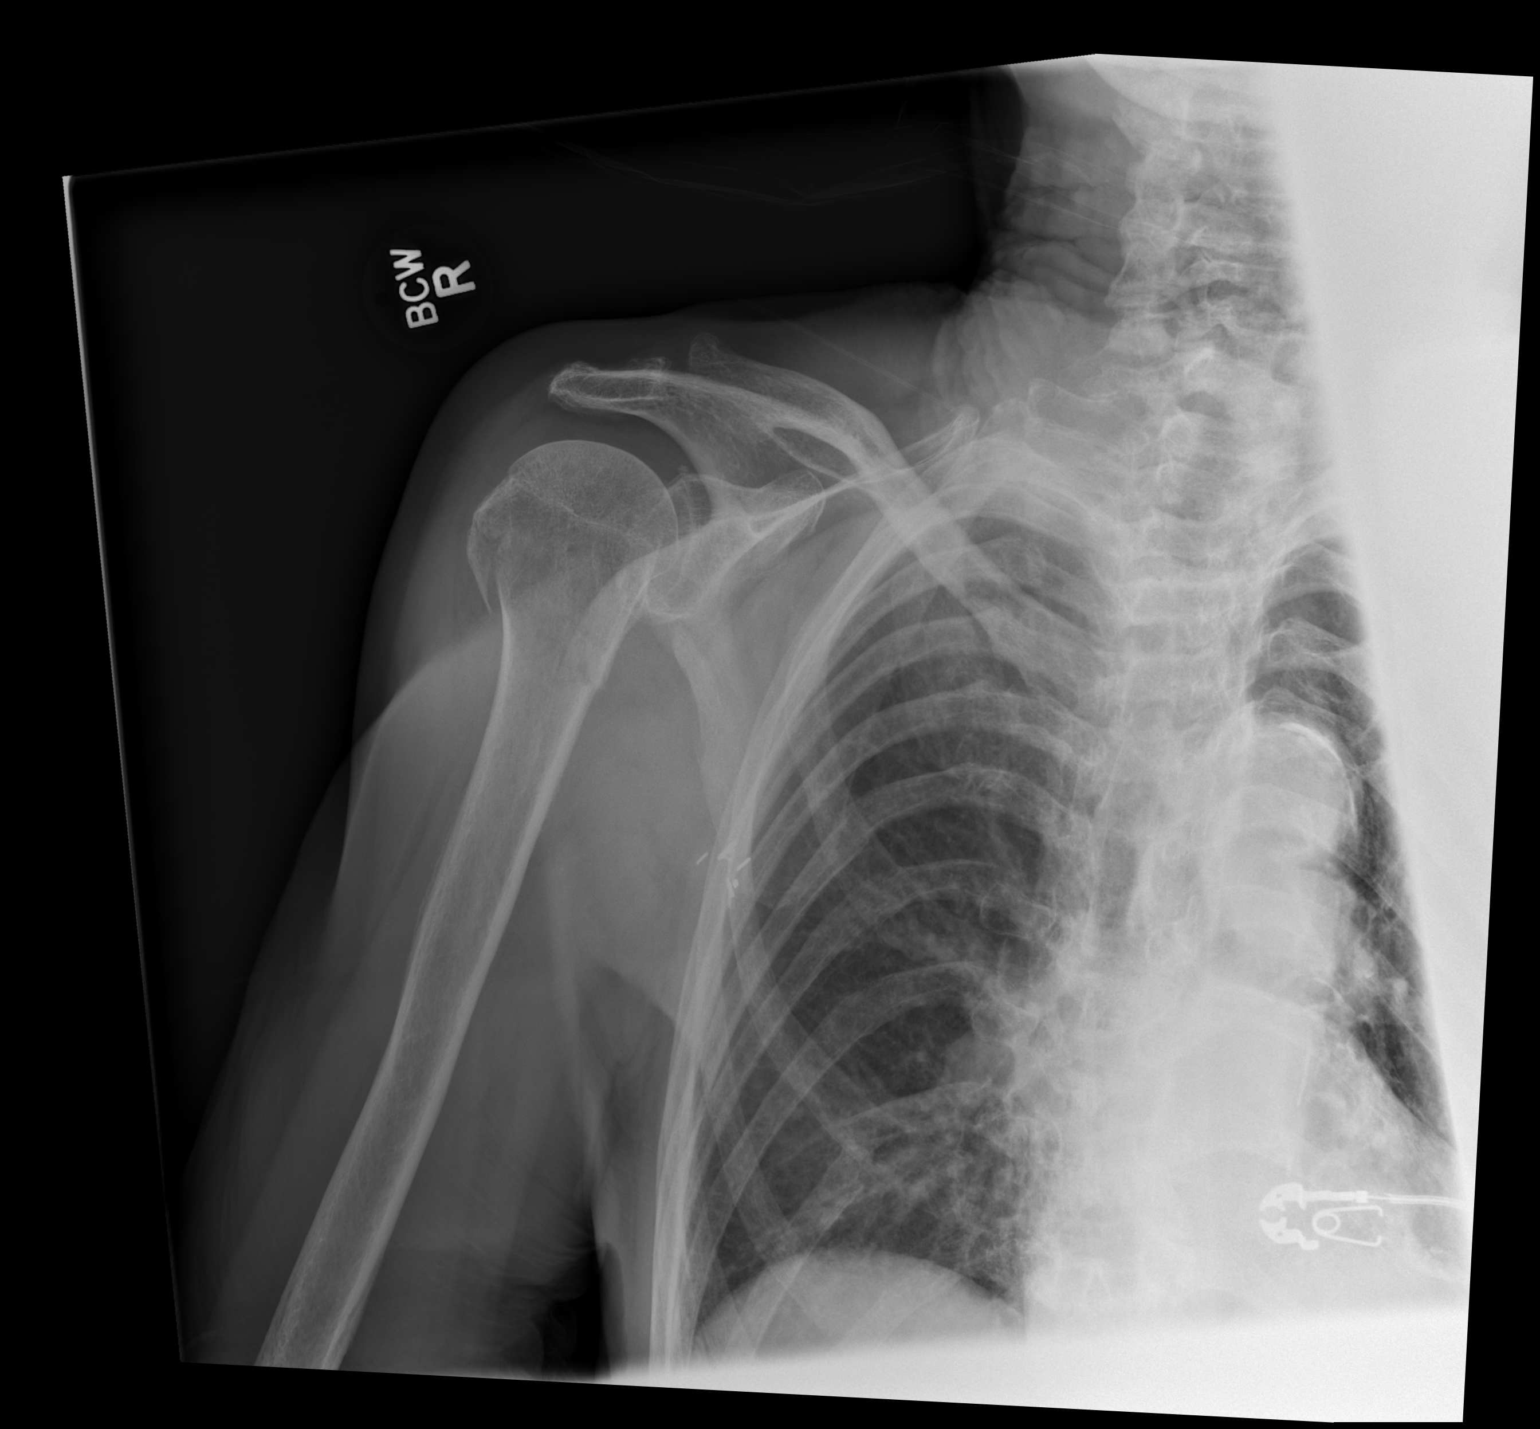

[x shoulder ap right (2 of 2)]
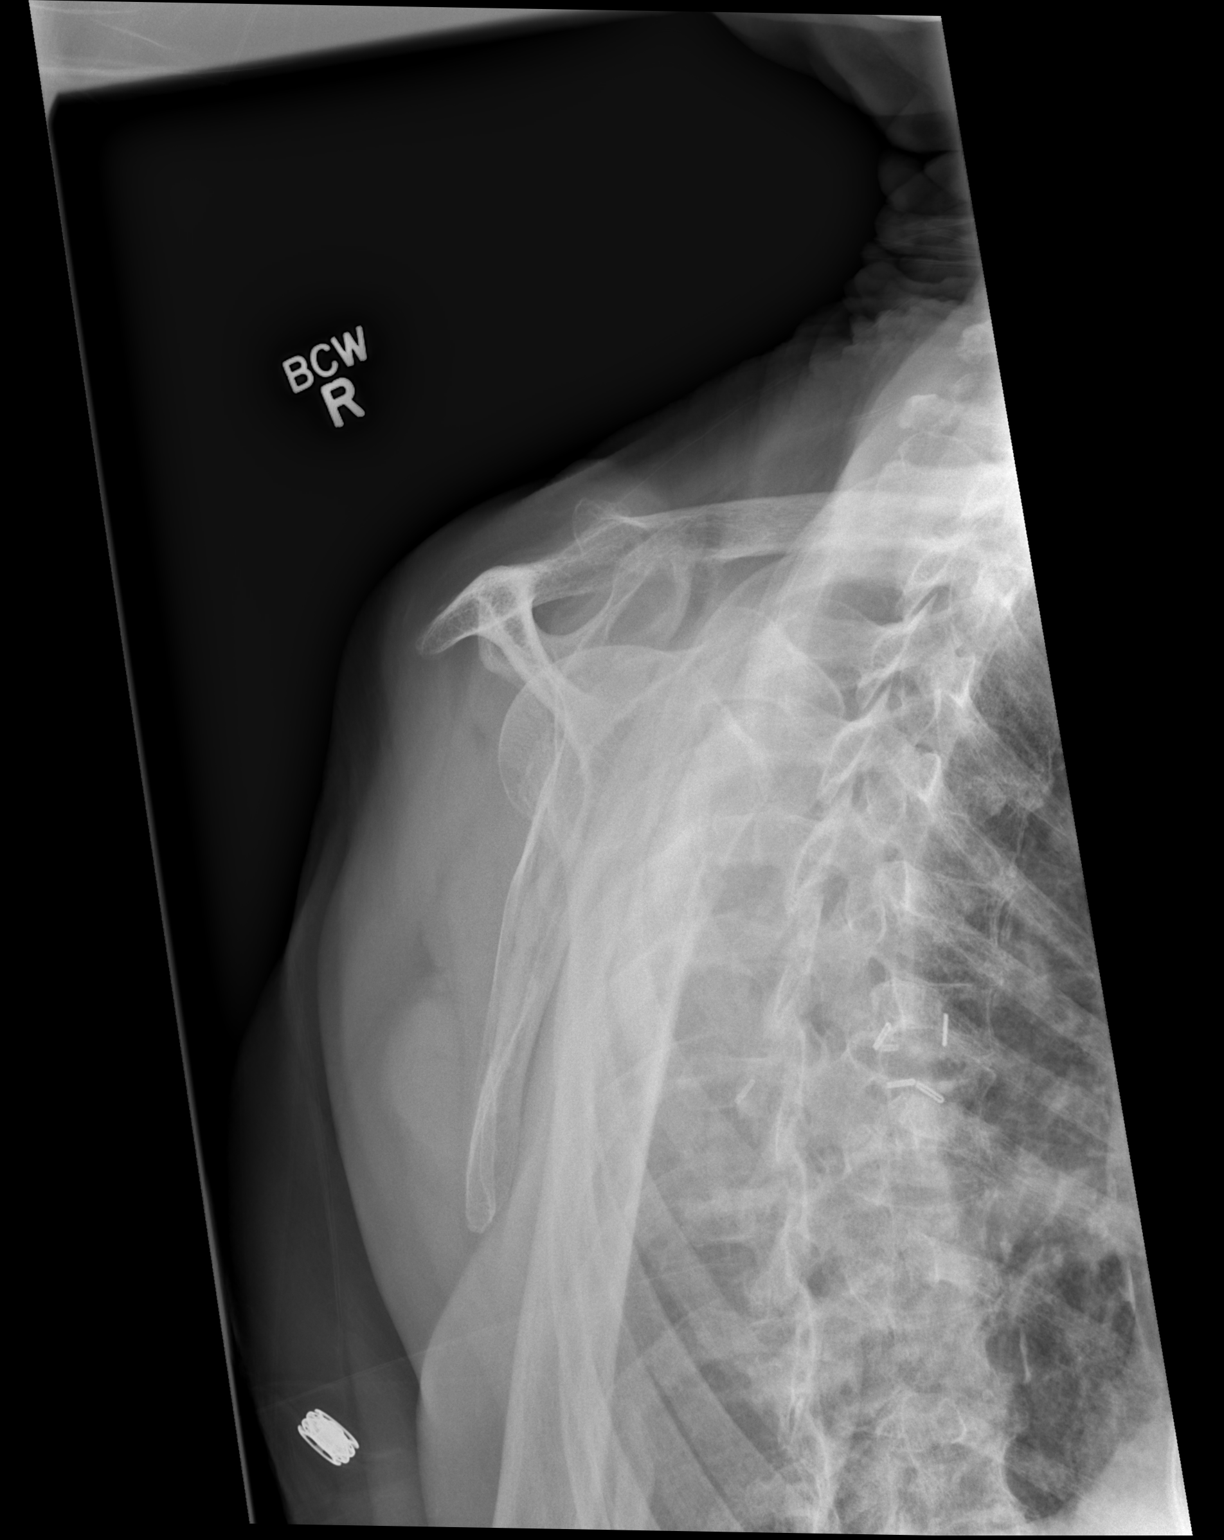

[2 of 2 positions shown; findings below may reference images not displayed]

FINDINGS: There is a mildly impacted fracture of the right humeral neck. No
dislocation. Acromioclavicular joint appears intact. Visualized
portion of the right chest is unremarkable. Surgical clips in the
right axilla.
IMPRESSION: Mildly impacted right humeral neck fracture.

## 2019-07-31 DEATH — deceased
